# Patient Record
Sex: Female | Born: 1990 | Race: White | Hispanic: No | Marital: Married | Smoking: Former smoker
Health system: Southern US, Community
[De-identification: ages and names within clinical notes are randomized; demographics above are authoritative.]

## PROBLEM LIST (undated history)

## (undated) DIAGNOSIS — G43909 Migraine, unspecified, not intractable, without status migrainosus: Secondary | ICD-10-CM

## (undated) DIAGNOSIS — Z8619 Personal history of other infectious and parasitic diseases: Secondary | ICD-10-CM

## (undated) DIAGNOSIS — F419 Anxiety disorder, unspecified: Secondary | ICD-10-CM

## (undated) DIAGNOSIS — I8390 Asymptomatic varicose veins of unspecified lower extremity: Secondary | ICD-10-CM

## (undated) DIAGNOSIS — R51 Headache: Secondary | ICD-10-CM

## (undated) DIAGNOSIS — I10 Essential (primary) hypertension: Secondary | ICD-10-CM

## (undated) DIAGNOSIS — R519 Headache, unspecified: Secondary | ICD-10-CM

## (undated) DIAGNOSIS — IMO0002 Reserved for concepts with insufficient information to code with codable children: Secondary | ICD-10-CM

## (undated) HISTORY — DX: Personal history of other infectious and parasitic diseases: Z86.19

## (undated) HISTORY — DX: Headache: R51

## (undated) HISTORY — DX: Asymptomatic varicose veins of unspecified lower extremity: I83.90

## (undated) HISTORY — DX: Reserved for concepts with insufficient information to code with codable children: IMO0002

## (undated) HISTORY — DX: Migraine, unspecified, not intractable, without status migrainosus: G43.909

## (undated) HISTORY — DX: Essential (primary) hypertension: I10

## (undated) HISTORY — DX: Anxiety disorder, unspecified: F41.9

## (undated) HISTORY — DX: Headache, unspecified: R51.9

---
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
## 2009-06-24 LAB — LIPID PANEL: Direct LDL: 72

## 2009-06-24 LAB — HEPATIC FUNCTION PANEL
AST: 21 U/L
Alkaline Phosphatase: 87 U/L

## 2009-06-24 LAB — VITAMIN D 25 HYDROXY (VIT D DEFICIENCY, FRACTURES): Vit D, 25-Hydroxy: 40.5

## 2009-06-24 LAB — TSH: TSH: 1.61

## 2011-07-31 ENCOUNTER — Encounter: Payer: Self-pay | Admitting: Family Medicine

## 2011-07-31 ENCOUNTER — Ambulatory Visit (INDEPENDENT_AMBULATORY_CARE_PROVIDER_SITE_OTHER): Payer: Managed Care, Other (non HMO) | Admitting: Family Medicine

## 2011-07-31 VITALS — BP 152/106 | HR 80 | Temp 98.3°F | Ht 69.5 in | Wt 169.0 lb

## 2011-07-31 DIAGNOSIS — I10 Essential (primary) hypertension: Secondary | ICD-10-CM

## 2011-07-31 DIAGNOSIS — R52 Pain, unspecified: Secondary | ICD-10-CM

## 2011-07-31 DIAGNOSIS — M542 Cervicalgia: Secondary | ICD-10-CM | POA: Insufficient documentation

## 2011-07-31 DIAGNOSIS — R51 Headache: Secondary | ICD-10-CM

## 2011-07-31 DIAGNOSIS — F439 Reaction to severe stress, unspecified: Secondary | ICD-10-CM | POA: Insufficient documentation

## 2011-07-31 DIAGNOSIS — R519 Headache, unspecified: Secondary | ICD-10-CM | POA: Insufficient documentation

## 2011-07-31 DIAGNOSIS — Z0000 Encounter for general adult medical examination without abnormal findings: Secondary | ICD-10-CM

## 2011-07-31 DIAGNOSIS — Z733 Stress, not elsewhere classified: Secondary | ICD-10-CM

## 2011-07-31 HISTORY — DX: Essential (primary) hypertension: I10

## 2011-07-31 LAB — COMPREHENSIVE METABOLIC PANEL
ALT: 16 U/L (ref 0–35)
AST: 19 U/L (ref 0–37)
Albumin: 4.5 g/dL (ref 3.5–5.2)
Calcium: 9.4 mg/dL (ref 8.4–10.5)
Chloride: 106 mEq/L (ref 96–112)
Creatinine, Ser: 0.6 mg/dL (ref 0.4–1.2)
Potassium: 4.7 mEq/L (ref 3.5–5.1)
Sodium: 143 mEq/L (ref 135–145)
Total Protein: 7.3 g/dL (ref 6.0–8.3)

## 2011-07-31 LAB — LIPID PANEL
LDL Cholesterol: 50 mg/dL (ref 0–99)
Total CHOL/HDL Ratio: 2

## 2011-07-31 LAB — CBC WITH DIFFERENTIAL/PLATELET
Basophils Absolute: 0 10*3/uL (ref 0.0–0.1)
Eosinophils Absolute: 0.1 10*3/uL (ref 0.0–0.7)
Hemoglobin: 14.2 g/dL (ref 12.0–15.0)
Lymphocytes Relative: 27.2 % (ref 12.0–46.0)
MCHC: 33.9 g/dL (ref 30.0–36.0)
Neutro Abs: 3.8 10*3/uL (ref 1.4–7.7)
Platelets: 256 10*3/uL (ref 150.0–400.0)
RDW: 12.7 % (ref 11.5–14.6)

## 2011-07-31 NOTE — Assessment & Plan Note (Signed)
Exam benign today. ?stressors contributing to feeling fatigued.  Check blood work today. Check TSH.

## 2011-07-31 NOTE — Progress Notes (Signed)
Subjective:    Patient ID: Jennifer Mays, female    DOB: [birth year], 20 y.o.   MRN: [id number]  HPI CC: new pt, establish  Here for checkup, needs pap smear.  Having "weird body pains" and headaches since Jan 2012.  Describes muscle and joint pains throughout body.  Ankles, lower back, between shoulders, elbows, wrists, hips, knees.  Stands at work all day, worse at work.  Not worse in am, no stiffness.  No joint swelling, redness.  Also feeling tired and without energy.  When bending over feels dizzy.  Denies fevers/chills, cough, other flu sxs.  H/o migraines in past per pt.  But different headaches recently.  Worsening headaches, usually on right side, posterior head.  Feels like sharp shooting pain, lasts ~1 hour.  Worsened with stress.  Associated with photophobia/phonophobia.  Associated with nausea/vomiting.  excedrin helps HA, takes about 3x/wk.  Prior taking alleve.  Needs wisdom teeth removed.  Wonders if contributing to HAs.  Husband says legs and arms twitch when sleeping, during light sleep not deep.  Denies fevers/chills, abd pain, n/v/d, red eyes, new rashes, oral lesions.  Denies skin or hair changes.  Endorses heat intolerance, no cold intolerance.  Endorses recent increase in stress recently - work, home, school.  Has been married for 2 years, husband's friend living with them.  To relieve stress, does yoga/pilates 3x/wk, walks dog.  No regular exercise otherwise.  Loves to read.  Likes to ride husband's motorcycle.  Would like to start birth control.  Only thing that has used which didn't cause prolonged spotting was yaz.  Has tried seasonique (nausea/vomiting) and nuvaring (spoptting).  LMP 07/18/2011.  Not currently on birth control.  Not using protection.  Period seem to last longer, 5-7 days, regular.  Normal flow.  Preventative: No recent physical (>37yr). Previously saw [facility]i Bamberger for pap, thinks 2010 and normal.  Has had abnormal pap smears - s/p LEEP age 22yo  2007.  2 normal pap smears since then. Thinks tetanus 2011. Flu shot today declined. No recent blood work - 2010.  fasting today.  Medications and allergies reviewed and updated in chart.  Past histories reviewed and updated if relevant as below. Patient Active Problem List  Diagnoses  . HTN (hypertension)   Past Medical History  Diagnosis Date  . History of chicken pox   . Generalized headaches   . Migraines    Past Surgical History  Procedure Date  . Tonsillectomy and adenoidectomy 2002   History  Substance Use Topics  . Smoking status: Former Smoker    Quit date: 03/07/2009  . Smokeless tobacco: Never Used  . Alcohol Use: Yes     Regular, 2-4 glasses of wine/night, weekends more   Family History  Problem Relation Age of Onset  . Hypertension Father   . Asthma Father   . Coronary artery disease Paternal Grandmother 74  . Diabetes Paternal Grandfather   . Stroke Paternal Grandfather   . Hypertension Paternal Grandfather   . Cancer Neg Hx   . Alcohol abuse Maternal Grandfather   . Asthma Sister   . Parkinsonism Maternal Aunt    Allergies  Allergen Reactions  . Sulfa Drugs Cross Reactors Hives   No current outpatient prescriptions on file prior to visit.   Review of Systems  Constitutional: Negative for fever, chills, activity change, appetite change, fatigue and unexpected weight change.  HENT: Negative for hearing loss and neck pain.   Eyes: Negative for visual disturbance.  Respiratory: Negative for cough,  chest tightness, shortness of breath and wheezing.   Cardiovascular: Negative for chest pain, palpitations and leg swelling.  Gastrointestinal: Negative for nausea, vomiting, abdominal pain, diarrhea, constipation, blood in stool and abdominal distention.  Genitourinary: Negative for hematuria and difficulty urinating.  Musculoskeletal: Negative for myalgias and arthralgias.  Skin: Negative for rash.  Neurological: Positive for headaches. Negative for  dizziness, seizures and syncope.  Hematological: Does not bruise/bleed easily.  Psychiatric/Behavioral: Negative for dysphoric mood. The patient is not nervous/anxious.       Objective:   Physical Exam  Nursing note and vitals reviewed. Constitutional: She is oriented to person, place, and time. She appears well-developed and well-nourished. No distress.  HENT:  Head: Normocephalic and atraumatic.  Right Ear: External ear normal.  Left Ear: External ear normal.  Nose: Nose normal.  Mouth/Throat: Oropharynx is clear and moist.  Eyes: Conjunctivae and EOM are normal. Pupils are equal, round, and reactive to light.  Neck: Normal range of motion. Neck supple. No thyromegaly present.  Cardiovascular: Normal rate, regular rhythm, normal heart sounds and intact distal pulses.   No murmur heard. Pulses:      Radial pulses are 2+ on the right side, and 2+ on the left side.  Pulmonary/Chest: Effort normal and breath sounds normal. No respiratory distress. She has no wheezes. She has no rales.  Abdominal: Soft. Bowel sounds are normal. She exhibits no distension and no mass. There is no tenderness. There is no rebound and no guarding.  Musculoskeletal: Normal range of motion.       No synovitis.  FROM joints without pain  Lymphadenopathy:    She has no cervical adenopathy.  Neurological: She is alert and oriented to person, place, and time. She has normal strength. She is not disoriented. No cranial nerve deficit or sensory deficit. She exhibits normal muscle tone. She displays a negative Romberg sign. Coordination and gait normal.  Reflex Scores:      Bicep reflexes are 2+ on the right side and 2+ on the left side.      Patellar reflexes are 2+ on the right side and 2+ on the left side.      Achilles reflexes are 2+ on the right side and 2+ on the left side.      Normal finger to nose.  Skin: Skin is warm and dry. No rash noted.  Psychiatric: She has a normal mood and affect. Her behavior is  normal. Judgment and thought content normal.      Assessment & Plan:

## 2011-07-31 NOTE — Patient Instructions (Addendum)
Keep an eye on blood pressure, elevated today. Return in 2-3 weeks for physical (pap) and recheck blood pressure. In meantime, work on watching salt intake, increasing fruits and vegetables (potassium) and water.  Goal sodium (salt) is <1.5gm/day. We will try to get records from Tomi Bamberger. Work on stress - try to back off alcohol and try to incorporate more activity into routine. Think about what things are worth worrying about and what isn't. Good to meet you today, call us with questions.

## 2011-07-31 NOTE — Assessment & Plan Note (Signed)
Anticipate migraine picture. rec continue excedrin for now as working well. Neuro nonfocal today.

## 2011-07-31 NOTE — Assessment & Plan Note (Addendum)
New, previous bp's 120/80s per pt. First time at new office, with new doctor, did discuss stressors today, may be contributing to elevated BP. Return in 2-3 wks for f/u, recheck then.  Blood work today to check on other causes. In meantime, work on lowering salt, increasing potassium in diet.  Push fluids. If staying elevated, start med, check EKG.

## 2011-07-31 NOTE — Assessment & Plan Note (Addendum)
Portion of visit spent discussing stressors and importance of healthy coping strategies. rec increase exercise, decrease EtOH intake, pt planning on taking next semester off from school, should help as well with decreased load. To return for CPE, discussion on OCP vs other birth control.

## 2011-08-09 ENCOUNTER — Encounter: Payer: Self-pay | Admitting: Family Medicine

## 2011-08-20 ENCOUNTER — Encounter: Payer: Self-pay | Admitting: Family Medicine

## 2011-08-20 ENCOUNTER — Ambulatory Visit (INDEPENDENT_AMBULATORY_CARE_PROVIDER_SITE_OTHER): Payer: Managed Care, Other (non HMO) | Admitting: Family Medicine

## 2011-08-20 VITALS — BP 142/96 | HR 80 | Temp 98.2°F | Wt 174.0 lb

## 2011-08-20 DIAGNOSIS — Z309 Encounter for contraceptive management, unspecified: Secondary | ICD-10-CM

## 2011-08-20 DIAGNOSIS — F419 Anxiety disorder, unspecified: Secondary | ICD-10-CM

## 2011-08-20 DIAGNOSIS — F411 Generalized anxiety disorder: Secondary | ICD-10-CM

## 2011-08-20 DIAGNOSIS — Z23 Encounter for immunization: Secondary | ICD-10-CM

## 2011-08-20 DIAGNOSIS — I10 Essential (primary) hypertension: Secondary | ICD-10-CM

## 2011-08-20 MED ORDER — AMLODIPINE BESYLATE 5 MG PO TABS: 5.0000 mg | ORAL_TABLET | Freq: Every day | ORAL | Status: DC

## 2011-08-20 MED ORDER — HYDROCHLOROTHIAZIDE 12.5 MG PO CAPS: 12.5000 mg | ORAL_CAPSULE | Freq: Every day | ORAL | Status: DC

## 2011-08-20 MED ORDER — CITALOPRAM HYDROBROMIDE 10 MG PO TABS: 10.0000 mg | ORAL_TABLET | Freq: Every day | ORAL | Status: DC

## 2011-08-20 NOTE — Patient Instructions (Addendum)
Flu shot today. Keep log of blood pressures over next 2 weeks and call me with update. Read up on depo shot and let us know if you want to start - may schedule for nurse visit. Return in 1 months for follow up. Reschedule pap smear. Continue with lifestyle changes including more water, fruits/vegetables, less sodium (goal <1.5gm/day). Start celexa one daily for anxiety - continue to work on reducing stressors.

## 2011-08-20 NOTE — Progress Notes (Signed)
Subjective:    Patient ID: Jennifer Mays, female    DOB: 1991/09/09, 20 y.o.   MRN: [id number]  HPI CC: CPE today as well as bp recheck, rescheduled CPE component.  On period currently - would like to reschedule pelvic exam.  HTN - staying elevated.  Has been watching salt intake.  Drinking more water, fruits/vegetables.  At home has run 130/90s (rarely checked).    OCP - Only thing that has used which didn't cause prolonged spotting was yaz.  Has tried seasonique (nausea/vomiting) and nuvaring (spotting) for months.  LMP 08/16/2011.  Currently on period, would like to reschedule pap.  Not currently on birth control. Not using protection. Period seem to last longer, 5-7 days, regular. Normal flow.  doesn't think would want estrogen containing med 2/2 elevated BP, nor implanon/IUD.  Headaches - about same.  excedrin helps control well.  Anxiety - has had h/o anxiety in past, h/o panic attacks, only 1 in last year which is good for her.  Does feel anxiety is an issue recently, thinking about counseling vs medicine.  Will look into coverage by insurance for counselor.  Things better at home, friends moved out.  worsening this year.  Would like to discuss meds.  Feels affects daily living.  Preventative:  No recent physical (>30yr).  Previously saw Tomi Bamberger for pap, thinks 2010 and normal. Has had abnormal pap smears - s/p LEEP age 63yo 2007. 2 normal pap smears since then.  Would like to reschedule pap. Thinks tetanus 2011.  Would like flu shot today.  Wt Readings from Last 3 Encounters:  08/20/11 174 lb (78.926 kg)  07/31/11 169 lb (76.658 kg)   Review of Systems  Constitutional: Negative for fever, chills, activity change, appetite change, fatigue and unexpected weight change.  HENT: Negative for hearing loss and neck pain.   Eyes: Negative for visual disturbance.  Respiratory: Positive for cough. Negative for chest tightness, shortness of breath and wheezing.   Cardiovascular:  Negative for chest pain, palpitations and leg swelling.  Gastrointestinal: Negative for nausea, vomiting, abdominal pain, diarrhea, constipation, blood in stool and abdominal distention.  Genitourinary: Negative for hematuria and difficulty urinating.  Musculoskeletal: Negative for myalgias and arthralgias.  Skin: Negative for rash.  Neurological: Positive for headaches. Negative for dizziness, seizures and syncope.  Hematological: Bruises/bleeds easily.  Psychiatric/Behavioral: Negative for dysphoric mood. The patient is not nervous/anxious.        Objective:   Physical Exam  Nursing note and vitals reviewed. Constitutional: She is oriented to person, place, and time. She appears well-developed and well-nourished. No distress.  HENT:  Head: Normocephalic and atraumatic.  Right Ear: External ear normal.  Left Ear: External ear normal.  Nose: Nose normal.  Mouth/Throat: Oropharynx is clear and moist.  Eyes: Conjunctivae and EOM are normal. Pupils are equal, round, and reactive to light.  Neck: Normal range of motion. Neck supple. No thyromegaly present.  Cardiovascular: Normal rate, regular rhythm, normal heart sounds and intact distal pulses.   No murmur heard. Pulses:      Radial pulses are 2+ on the right side, and 2+ on the left side.  Pulmonary/Chest: Effort normal and breath sounds normal. No respiratory distress. She has no wheezes. She has no rales.  Abdominal: Soft. Bowel sounds are normal. She exhibits no distension and no mass. There is no tenderness. There is no rebound and no guarding.  Musculoskeletal: Normal range of motion.  Lymphadenopathy:    She has no cervical adenopathy.  Neurological: She  is alert and oriented to person, place, and time.       CN grossly intact, station and gait intact  Skin: Skin is warm and dry. No rash noted.  Psychiatric: She has a normal mood and affect. Her behavior is normal. Judgment and thought content normal.      Assessment &  Plan:

## 2011-08-20 NOTE — Assessment & Plan Note (Addendum)
GAD7 18/21.  Does impact ability to function. Thinking about taking semester off school next semester, will look into counseling for anxiety, will check into insurance coverage of this. Interested in pharmacotherapy to help.  Discussed other coping strategies for stress.   Discussed celexa, start at 10mg , discussed duration prior to taking effect, side effects to watch for. Denies SI/HI. RTC 1 mo for f/u. A total of 30 minutes were spent face-to-face with the patient during this encounter and over half of that time was spent on counseling and coordination of care

## 2011-08-20 NOTE — Assessment & Plan Note (Addendum)
Reviewed blood work with pt.  Great numbers. BP remaining elevated. Sulfa allergy, sinus brady on EKG. Discussed starting med.  Pt hesitant for this.  Will start anxiety med to see if can attain better control of anxiety and hopefully better control of BP with this. If not improving, will start med.  Consider amlodipine.  EKG - sinus brady at 58, nl axis, intervals, no ST/T changes.

## 2011-08-20 NOTE — Assessment & Plan Note (Signed)
Reviewed options in detail. Pt would like to avoid estrogen given elevated bp. Thinks would like to try depo shots.  Provided with reading material on this.  If desired, may call to schedule nurse visit.  check Upreg prior to starting.

## 2011-08-27 ENCOUNTER — Ambulatory Visit: Payer: Managed Care, Other (non HMO) | Admitting: Family Medicine

## 2011-08-27 DIAGNOSIS — Z0289 Encounter for other administrative examinations: Secondary | ICD-10-CM

## 2011-09-19 ENCOUNTER — Ambulatory Visit: Payer: Managed Care, Other (non HMO) | Admitting: Family Medicine

## 2011-10-09 ENCOUNTER — Ambulatory Visit: Payer: Managed Care, Other (non HMO) | Admitting: Family Medicine

## 2011-10-09 DIAGNOSIS — Z0289 Encounter for other administrative examinations: Secondary | ICD-10-CM

## 2012-02-05 DIAGNOSIS — Z8619 Personal history of other infectious and parasitic diseases: Secondary | ICD-10-CM

## 2012-02-05 HISTORY — DX: Personal history of other infectious and parasitic diseases: Z86.19

## 2012-02-12 ENCOUNTER — Telehealth: Payer: Self-pay | Admitting: Family Medicine

## 2012-02-12 ENCOUNTER — Ambulatory Visit (INDEPENDENT_AMBULATORY_CARE_PROVIDER_SITE_OTHER): Payer: Managed Care, Other (non HMO) | Admitting: Family Medicine

## 2012-02-12 ENCOUNTER — Encounter: Payer: Self-pay | Admitting: *Deleted

## 2012-02-12 ENCOUNTER — Encounter: Payer: Self-pay | Admitting: Family Medicine

## 2012-02-12 ENCOUNTER — Other Ambulatory Visit (HOSPITAL_COMMUNITY)
Admission: RE | Admit: 2012-02-12 | Discharge: 2012-02-12 | Disposition: A | Discharge status: Home / Self Care | Payer: Managed Care, Other (non HMO) | Source: Ambulatory Visit | Attending: Family Medicine | Admitting: Family Medicine

## 2012-02-12 VITALS — BP 132/80 | HR 84 | Temp 98.0°F | Wt 177.8 lb

## 2012-02-12 DIAGNOSIS — N923 Ovulation bleeding: Secondary | ICD-10-CM

## 2012-02-12 DIAGNOSIS — N921 Excessive and frequent menstruation with irregular cycle: Secondary | ICD-10-CM

## 2012-02-12 DIAGNOSIS — Z01419 Encounter for gynecological examination (general) (routine) without abnormal findings: Secondary | ICD-10-CM | POA: Insufficient documentation

## 2012-02-12 DIAGNOSIS — Z113 Encounter for screening for infections with a predominantly sexual mode of transmission: Secondary | ICD-10-CM

## 2012-02-12 DIAGNOSIS — R1031 Right lower quadrant pain: Secondary | ICD-10-CM

## 2012-02-12 DIAGNOSIS — N898 Other specified noninflammatory disorders of vagina: Secondary | ICD-10-CM | POA: Insufficient documentation

## 2012-02-12 DIAGNOSIS — Z124 Encounter for screening for malignant neoplasm of cervix: Secondary | ICD-10-CM

## 2012-02-12 DIAGNOSIS — Z309 Encounter for contraceptive management, unspecified: Secondary | ICD-10-CM

## 2012-02-12 DIAGNOSIS — I10 Essential (primary) hypertension: Secondary | ICD-10-CM

## 2012-02-12 LAB — POCT URINALYSIS DIPSTICK
Glucose, UA: NEGATIVE
Ketones, UA: NEGATIVE
Leukocytes, UA: NEGATIVE
Protein, UA: NEGATIVE
Urobilinogen, UA: 0.2

## 2012-02-12 LAB — POCT URINE PREGNANCY: Preg Test, Ur: NEGATIVE

## 2012-02-12 LAB — WET PREP, GENITAL
Trich, Wet Prep: NONE SEEN
Yeast Wet Prep HPF POC: NONE SEEN

## 2012-02-12 MED ORDER — DOXYCYCLINE HYCLATE 100 MG PO CAPS: 100.0000 mg | ORAL_CAPSULE | Freq: Two times a day (BID) | ORAL | Status: AC

## 2012-02-12 MED ORDER — CEFTRIAXONE SODIUM 1 G IJ SOLR
250.0000 mg | Freq: Once | INTRAMUSCULAR | Status: AC
  Administered 2012-02-12: 250 mg via INTRAMUSCULAR

## 2012-02-12 MED ORDER — DROSPIRENONE-ETHINYL ESTRADIOL 3-0.02 MG PO TABS: 1.0000 | ORAL_TABLET | Freq: Every day | ORAL | Status: DC

## 2012-02-12 NOTE — Assessment & Plan Note (Signed)
Started after episode of unprotected sex with new partner. Menstrual irregularities since then as well. Exam consistent with cervicitis - anticipate chlamydia or gonorrhea infection.  Treated today with shot of CTX 250mg  IM, also start doxycycline to cover clamydial infection. Discussed abstinence until pt completes treatment, discussed needs to have partner tested/treated. Discussed importance of protection 100% of time. Sent wet prep, checked HIV/RPR today. Upreg neg today.

## 2012-02-12 NOTE — Progress Notes (Signed)
  Subjective:    Patient ID: Jennifer Mays, female    DOB: 04-11-1991, 21 y.o.   MRN: [id number]  HPI CC: spotting and would like pap  Prior CPE, deferred pap 2/2 on period.  Did not return until today for pap.  Never filled celexa because never received by pharmacy and doesn't think needs anymore.  Since February, having cramping pain lower abd with cycle, periods that were prior regular and lasted 5 days, now lasting up to 8-10 days.  LMP 01/27/2012, continued spotting since then although minimal.   Some cramping pain with urination but no dysuria.  No frequency, urgency.  Currently sexually active, did have 1 new partner in January and did not use protection at that time.  Dark green discharge noted 10 d after unprotected episode.  No itching or new rash.  Denies fevers/chills, nausea/vomiting.  OCP - Only thing that has used which didn't cause prolonged spotting was yaz. Has tried seasonique (nausea/vomiting) and nuvaring (spotting) for several months. Not currently on birth control.  Not using protection.  Doesn't think would want implanon/IUD.  Previously saw Tomi Bamberger for pap, thinks last done 2010 and normal.  Has had abnormal pap smears - s/p LEEP age 89yo 2007. 2 normal pap smears since then. Would like pap today.  H/o elevated BP readings.  + fmhx HTN BP Readings from Last 3 Encounters:  02/12/12 132/80  08/20/11 142/96  07/31/11 152/106    Past Medical History  Diagnosis Date  . History of chicken pox   . Generalized headaches   . Migraines   . Abnormal Pap smear and cervical HPV (human papillomavirus) 2007    s/p LEEP    Review of Systems Per HPI    Objective:   Physical Exam  Nursing note and vitals reviewed. Constitutional: She appears well-developed and well-nourished. No distress.  HENT:  Head: Normocephalic and atraumatic.  Mouth/Throat: Oropharynx is clear and moist. No oropharyngeal exudate.  Eyes: Conjunctivae and EOM are normal. Pupils are  equal, round, and reactive to light. No scleral icterus.  Neck: Normal range of motion. Neck supple.  Cardiovascular: Normal rate, regular rhythm, normal heart sounds and intact distal pulses.   No murmur heard. Pulmonary/Chest: Effort normal and breath sounds normal. No respiratory distress. She has no wheezes. She has no rales.  Abdominal: Soft. Bowel sounds are normal. She exhibits no distension and no mass. There is no hepatosplenomegaly. There is tenderness (mild pressure pain with deep palpation) in the right lower quadrant and suprapubic area. There is no rebound, no guarding and no CVA tenderness.  Genitourinary: Pelvic exam was performed with patient supine. No labial fusion. There is no rash, tenderness, lesion or injury on the right labia. There is no rash, tenderness, lesion or injury on the left labia. Uterus is not enlarged and not tender. Cervix exhibits discharge. Cervix exhibits no motion tenderness and no friability. Right adnexum displays no mass, no tenderness and no fullness. Left adnexum displays no mass and no tenderness. No erythema, tenderness or bleeding around the vagina. No foreign body around the vagina. No signs of injury around the vagina. Vaginal discharge found.       Cervical discharge present, erythematous os No cervical motion tenderness or tenderness with bimanual exam  Musculoskeletal: She exhibits no edema.  Skin: Skin is warm and dry. No rash noted.  Psychiatric: She has a normal mood and affect.       Assessment & Plan:

## 2012-02-12 NOTE — Assessment & Plan Note (Signed)
Start Yaz.  Discussed how to start, rtc 62mo to recheck blood pressure.

## 2012-02-12 NOTE — Patient Instructions (Addendum)
Blood pressure better today but still elevated. Pass by lab for blood work. Suspicion for STD - treated with shot of rocephin, also take doxycycline twice daily for 7 days. Start yaz - sent to pharmacy.  Start on first day of next period. Return in 1 month after starting to check blood pressure and see if we need medicine for better control on birth control. Good to see you today, call us with questions. No sex until finish treatment, and until partner treated as well.

## 2012-02-12 NOTE — Assessment & Plan Note (Signed)
H/o elevated BP, but today stable off meds.  fmhx HTN.  prehypertensive blood pressure today. Starting yaz, return in 1 mo to recheck BP.

## 2012-02-14 ENCOUNTER — Other Ambulatory Visit: Payer: Self-pay | Admitting: Family Medicine

## 2012-02-14 ENCOUNTER — Encounter: Payer: Self-pay | Admitting: Family Medicine

## 2012-02-14 MED ORDER — METRONIDAZOLE 500 MG PO TABS: 500.0000 mg | ORAL_TABLET | Freq: Two times a day (BID) | ORAL | Status: AC

## 2012-03-07 DIAGNOSIS — I8390 Asymptomatic varicose veins of unspecified lower extremity: Secondary | ICD-10-CM

## 2012-03-07 HISTORY — DX: Asymptomatic varicose veins of unspecified lower extremity: I83.90

## 2012-03-26 ENCOUNTER — Ambulatory Visit (INDEPENDENT_AMBULATORY_CARE_PROVIDER_SITE_OTHER): Payer: Managed Care, Other (non HMO) | Admitting: Family Medicine

## 2012-03-26 ENCOUNTER — Encounter: Payer: Self-pay | Admitting: *Deleted

## 2012-03-26 ENCOUNTER — Encounter: Payer: Self-pay | Admitting: Family Medicine

## 2012-03-26 VITALS — BP 150/98 | HR 60 | Temp 98.4°F | Wt 177.2 lb

## 2012-03-26 DIAGNOSIS — I10 Essential (primary) hypertension: Secondary | ICD-10-CM

## 2012-03-26 DIAGNOSIS — M79604 Pain in right leg: Secondary | ICD-10-CM | POA: Insufficient documentation

## 2012-03-26 DIAGNOSIS — I8390 Asymptomatic varicose veins of unspecified lower extremity: Secondary | ICD-10-CM

## 2012-03-26 DIAGNOSIS — I83813 Varicose veins of bilateral lower extremities with pain: Secondary | ICD-10-CM | POA: Insufficient documentation

## 2012-03-26 DIAGNOSIS — M79609 Pain in unspecified limb: Secondary | ICD-10-CM

## 2012-03-26 MED ORDER — NAPROXEN 500 MG PO TABS: ORAL_TABLET | ORAL | Status: DC

## 2012-03-26 NOTE — Assessment & Plan Note (Signed)
bp elevated today, will ask her to monitor at home, if staying elevated, start HCTZ. BP Readings from Last 3 Encounters:  03/26/12 150/98  02/12/12 132/80  08/20/11 142/96

## 2012-03-26 NOTE — Patient Instructions (Addendum)
Keep an eye on blood pressure at store or pharmacy - if staying consistently >140/90, let me know for treatment. I think pain is coming from irritated varicose vein. Treat with prescription strength naprosyn for 5-7 days then as needed. Elevate legs as much as able.  Continue knee sleeve if helping. If not improving, see vein doctor (let us know if you need referral). Good to see you today, call us with questions.  Varicose Veins Varicose veins are veins that have become enlarged and twisted. CAUSES This condition is the result of valves in the veins not working properly. Valves in the veins help return blood from the leg to the heart. If these valves are damaged, blood flows backwards and backs up into the veins in the leg near the skin. This causes the veins to become larger. People who are on their feet a lot, who are pregnant, or who are overweight are more likely to develop varicose veins. SYMPTOMS   Bulging, twisted-appearing, bluish veins, most commonly found on the legs.   Leg pain or a feeling of heaviness. These symptoms may be worse at the end of the day.   Leg swelling.   Skin color changes.  DIAGNOSIS  Varicose veins can usually be diagnosed with an exam of your legs by your caregiver. He or she may recommend an ultrasound of your leg veins. TREATMENT  Most varicose veins can be treated at home.However, other treatments are available for people who have persistent symptoms or who want to treat the cosmetic appearance of the varicose veins. These include:  Laser treatment of very small varicose veins.   Medicine that is shot (injected) into the vein. This medicine hardens the walls of the vein and closes off the vein. This treatment is called sclerotherapy. Afterwards, you may need to wear clothing or bandages that apply pressure.   Surgery.  HOME CARE INSTRUCTIONS   Do not stand or sit in one position for long periods of time. Do not sit with your legs crossed. Rest with  your legs raised during the day.   Wear elastic stockings or support hose. Do not wear other tight, encircling garments around the legs, pelvis, or waist.   Walk as much as possible to increase blood flow.   Raise the foot of your bed at night with 2-inch blocks.   If you get a cut in the skin over the vein and the vein bleeds, lie down with your leg raised and press on it with a clean cloth until the bleeding stops. Then place a bandage (dressing) on the cut. See your caregiver if it continues to bleed or needs stitches.  SEEK MEDICAL CARE IF:   The skin around your ankle starts to break down.   You have pain, redness, tenderness, or hard swelling developing in your leg over a vein.   You are uncomfortable due to leg pain.  Document Released: 07/03/2005 Document Revised: 09/12/2011 Document Reviewed: 11/19/2010 [facility] Patient Information 2012 [place], [place].

## 2012-03-26 NOTE — Progress Notes (Signed)
  Subjective:    Patient ID: Jennifer Mays, female    DOB: Nov 23, 1990, 23 y.o.   MRN: [id number]  HPI CC: R leg issues  R knee problems for a few years.    Knee started hurting in February (after worked 21 d straight).  Worse over last 2 months.  Swelling noted 3 wks ago.  Denies inciting trauma/injury to knee or leg.  Works in Teaching laboratory technician, standing all day.  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  No back pain.  No fevers/chills, no shooting pain down legs.  No foot or ankle pain.  No saddle anesthesia.  Recently noticed bruising - ie right anterior foot and right medial wrist.  Occasionally feels electrical shock shooting down right leg.  Wears what sounds like neoprene sleeve on right knee when at work.  Has been taking naprosyn OTC 220mg  which does help some.  Past Medical History  Diagnosis Date  . History of chicken pox   . Generalized headaches   . Migraines   . Abnormal Pap smear and cervical HPV (human papillomavirus) 2007    s/p LEEP  . History of chlamydia 02/2012    treated    Review of Systems Per HPI    Objective:   Physical Exam  Nursing note and vitals reviewed. Constitutional: She appears well-developed and well-nourished. No distress.  Musculoskeletal: She exhibits no edema.       FROM at knees .  No crepitus with motion or tenderness to palpation Neg drawer test, neg mcmurrays, neg PF grind. No abnormal patellar mobility  Skin: Skin is warm and dry. No rash noted.       Varicose vein overlying right patella, somewhat tender to touch      Assessment & Plan:

## 2012-03-26 NOTE — Assessment & Plan Note (Signed)
Anticipate varicose vein causing many of these sxs. Pt endorses strong fmhx vein insufficiency. Treat with elevation of leg, NSAID. Consider ASA, HCTZ. If not improving with above treatment, would refer to vein specialists for further eval.

## 2012-03-26 NOTE — Assessment & Plan Note (Signed)
Could very well have knee issue, however exam normal today, anticipate majority of sxs due to inflamed varicose vein

## 2012-04-21 ENCOUNTER — Ambulatory Visit (INDEPENDENT_AMBULATORY_CARE_PROVIDER_SITE_OTHER): Payer: Managed Care, Other (non HMO) | Admitting: Family Medicine

## 2012-04-21 ENCOUNTER — Encounter: Payer: Self-pay | Admitting: Family Medicine

## 2012-04-21 VITALS — BP 136/90 | HR 88 | Temp 98.0°F | Wt 179.8 lb

## 2012-04-21 DIAGNOSIS — M542 Cervicalgia: Secondary | ICD-10-CM

## 2012-04-21 DIAGNOSIS — R071 Chest pain on breathing: Secondary | ICD-10-CM

## 2012-04-21 DIAGNOSIS — R0789 Other chest pain: Secondary | ICD-10-CM

## 2012-04-21 MED ORDER — CYCLOBENZAPRINE HCL 10 MG PO TABS: 10.0000 mg | ORAL_TABLET | Freq: Two times a day (BID) | ORAL | Status: AC | PRN

## 2012-04-21 NOTE — Patient Instructions (Addendum)
You do have right trapezius tightness/strain.  Treat with flexeril twice daily as needed for muscle relaxant.  Use caution as it can make you sleepy - if too strong, cut in half. Try massage. If pain with breathing recurs, let me know for blood work. Good to see you today, call us with questions. If not improving, return in 1 month.  If worsening, return sooner.

## 2012-04-21 NOTE — Progress Notes (Signed)
Subjective:    Patient ID: Jennifer Mays, female    DOB: 03-11-1991, 21 y.o.   MRN: [id number]  HPI CC: neck and elbow pain on right side  "bones hurt".  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  Took a leave of absence at work because not feeling well.  Neck pain and right arm pain for the last year.  No midline pain.  Worse when at computer, worse when looking down.  Also endorses paresthesias from neck to pinky, travels down lateral/posterior arm.  Only noted in 4th and 5th digits.  Tried naprosyn for arm pain, didn't help.  Tried heating pad which did help some.  Denies inciting trauma/fall.  No fevers/chills, no arm weakness.  No vision changes.  H/o migraines, currently also with HA described as frontal ache different from migraines.  Also with lower back pain, longstanding. Also with dizziness when looking down, described as some imbalance.  No falls, no presyncope or LOC or vertigo per se.  Endorsing some right sided chest pain and shoulder pain with deep breaths.  This has improved over last 3 months.  Denies SOB, leg pain, cough.  Off yaz.  No fm or personal history of blood clots.  No recent travel or immobility.  Continues to smoke.  Wt Readings from Last 3 Encounters:  04/21/12 179 lb 12 oz (81.534 kg)  03/26/12 177 lb 4 oz (80.4 kg)  02/12/12 177 lb 12 oz (80.627 kg)   Bought cuff - states bp running better at home.  Stopped yaz, because was causing HAs.  Past Medical History  Diagnosis Date  . History of chicken pox   . Generalized headaches   . Migraines   . Abnormal Pap smear and cervical HPV (human papillomavirus) 2007    s/p LEEP  . History of chlamydia 02/2012    treated   Review of Systems Per HPI    Objective:   Physical Exam  Nursing note and vitals reviewed. Constitutional: She appears well-developed and well-nourished. No distress.  HENT:  Head: Normocephalic and atraumatic.  Mouth/Throat: Oropharynx is clear  and moist. No oropharyngeal exudate.  Eyes: Conjunctivae and EOM are normal. Pupils are equal, round, and reactive to light. No scleral icterus.  Neck: Normal range of motion. Neck supple.  Cardiovascular: Normal rate, regular rhythm, normal heart sounds and intact distal pulses.   No murmur heard. Pulmonary/Chest: Effort normal and breath sounds normal. No respiratory distress. She has no wheezes. She has no rales.  Musculoskeletal:       No midline spine tenderness. FROM at neck, some tenderness with rotation to right. + L thoracic paraspinous mm tightness as well as R trapezius mm tightness/spasm and pain.  FROM at shoulders. No pain with palpation of shoulder, no deformity noted. No pain with testing SITS against resistance in int/ext rotation or empty can sign.  No pain with palpation of right elbow or wrist.  No pain at medial or lateral epicondyle.  FROM elbow, wrist. no active synovitis appreciated.  Lymphadenopathy:    She has no cervical adenopathy.  Neurological: She has normal strength. No sensory deficit. She exhibits normal muscle tone. Coordination and gait normal.  Reflex Scores:      Bicep reflexes are 2+ on the right side and 2+ on the left side.      Brachioradialis reflexes are 2+ on the right side and 2+ on the left side.      Neg tinel/phalen.  Skin: Skin  is warm and dry. No rash noted.  Psychiatric: She has a normal mood and affect.       Assessment & Plan:

## 2012-04-23 ENCOUNTER — Encounter: Payer: Self-pay | Admitting: Family Medicine

## 2012-04-23 DIAGNOSIS — M940 Chondrocostal junction syndrome [Tietze]: Secondary | ICD-10-CM | POA: Insufficient documentation

## 2012-04-23 NOTE — Assessment & Plan Note (Signed)
Anticipate MSK source.   Encouraged smoking cessation. Discussed risk factors and signs/symptoms of blood clot.   As improving, will continue to monitor for now.

## 2012-04-23 NOTE — Assessment & Plan Note (Addendum)
With some pain down right arm. Evident trapezius muscle spasms and tenderness. Recommended muscle relaxant, NSAID, and ice/heat. If not improving, to return for further eval, consider xrays as longstanding sxs. No red flags today. Also discussed chiropractor and massage therapy.

## 2012-04-26 ENCOUNTER — Encounter: Payer: Self-pay | Admitting: Family Medicine

## 2012-04-29 ENCOUNTER — Ambulatory Visit: Payer: Managed Care, Other (non HMO) | Admitting: Psychology

## 2013-01-12 ENCOUNTER — Encounter: Payer: Self-pay | Admitting: Family Medicine

## 2013-01-12 ENCOUNTER — Ambulatory Visit (INDEPENDENT_AMBULATORY_CARE_PROVIDER_SITE_OTHER): Payer: Managed Care, Other (non HMO) | Admitting: Family Medicine

## 2013-01-12 VITALS — BP 124/90 | HR 64 | Temp 98.1°F | Ht 69.25 in | Wt 197.8 lb

## 2013-01-12 DIAGNOSIS — N912 Amenorrhea, unspecified: Secondary | ICD-10-CM

## 2013-01-12 DIAGNOSIS — I10 Essential (primary) hypertension: Secondary | ICD-10-CM

## 2013-01-12 DIAGNOSIS — R51 Headache: Secondary | ICD-10-CM

## 2013-01-12 DIAGNOSIS — Z3400 Encounter for supervision of normal first pregnancy, unspecified trimester: Secondary | ICD-10-CM | POA: Insufficient documentation

## 2013-01-12 DIAGNOSIS — Z3401 Encounter for supervision of normal first pregnancy, first trimester: Secondary | ICD-10-CM

## 2013-01-12 LAB — POCT URINE PREGNANCY: Preg Test, Ur: POSITIVE

## 2013-01-12 NOTE — Assessment & Plan Note (Signed)
Borderline in past, strong fmhx.  Will expedite referral to OB given hx HTN. Has been on HCTZ in past. Did not start med today. BP Readings from Last 3 Encounters:  01/12/13 124/90  04/21/12 136/90  03/26/12 150/98

## 2013-01-12 NOTE — Assessment & Plan Note (Signed)
Discussed tylenol use and questionable ADHD association in news.

## 2013-01-12 NOTE — Patient Instructions (Addendum)
Pass by Marion's office for referral to OBGYN to establish (given history of elevated blood pressures in past). Good to see you today, congratulations!  Pregnancy If you are planning on getting pregnant, it is a good idea to make a preconception appointment with your caregiver to discuss having a healthy lifestyle before getting pregnant. This includes diet, weight, exercise, taking prenatal vitamins (especially folic acid, which helps prevent brain and spinal cord defects), avoiding alcohol, smoking and illegal drugs, medical problems (diabetes, convulsions), family history of genetic problems, working conditions, and immunizations. It is better to have knowledge of these things and do something about them before getting pregnant. During your pregnancy, it is important to follow certain guidelines in order to have a healthy baby. It is very important to get good prenatal care and follow your caregiver's instructions. Prenatal care includes all the medical care you receive before your baby's birth. This helps to prevent problems during the pregnancy and childbirth. HOME CARE INSTRUCTIONS   Start your prenatal visits by the 12th week of pregnancy or earlier, if possible. At first, appointments are usually scheduled monthly. They become more frequent in the last 2 months before delivery. It is important that you keep your caregiver's appointments and follow your caregiver's instructions regarding medication use, exercise, and diet.  During pregnancy, you are providing food for you and your baby. Eat a regular, well-balanced diet. Choose foods such as meat, fish, milk and other dairy products, vegetables, fruits, whole-grain breads and cereals. Your caregiver will inform you of the ideal weight gain depending on your current height and weight. Drink lots of liquids. Try to drink 8 glasses of water a day.  Alcohol is associated with a number of birth defects including fetal alcohol syndrome. It is best to  avoid alcohol completely. Smoking will cause low birth rate and prematurity. Use of alcohol and nicotine during your pregnancy also increases the chances that your child will be chemically dependent later in their life and may contribute to SIDS (Sudden Infant Death Syndrome).  Do not use illegal drugs.  Only take prescription or over-the-counter medications that are recommended by your caregiver. Other medications can cause genetic and physical problems in the baby.  Morning sickness can often be helped by keeping soda crackers at the bedside. Eat a few before getting up in the morning.  A sexual relationship may be continued until near the end of pregnancy if there are no other problems such as early (premature) leaking of amniotic fluid from the membranes, vaginal bleeding, painful intercourse or belly (abdominal) pain.  Exercise regularly. Check with your caregiver if you are unsure of the safety of some of your exercises.  Do not use hot tubs, steam rooms or saunas. These increase the risk of fainting and hurting yourself and the baby. Swimming is OK for exercise. Get plenty of rest, including afternoon naps when possible, especially in the third trimester.  Avoid toxic odors and chemicals.  Do not wear high heels. They may cause you to lose your balance and fall.  Do not lift over 5 pounds. If you do lift anything, lift with your legs and thighs, not your back.  Avoid long trips, especially in the third trimester.  If you have to travel out of the city or state, take a copy of your medical records with you. SEEK IMMEDIATE MEDICAL CARE IF:   You develop an unexplained oral temperature above 102 F (38.9 C), or as your caregiver suggests.  You have leaking of fluid  from the vagina. If leaking membranes are suspected, take your temperature and inform your caregiver of this when you call.  There is vaginal spotting or bleeding. Notify your caregiver of the amount and how many pads are  used.  You continue to feel sick to your stomach (nauseous) and have no relief from remedies suggested, or you throw up (vomit) blood or coffee ground like materials.  You develop upper abdominal pain.  You have round ligament discomfort in the lower abdominal area. This still must be evaluated by your caregiver.  You feel contractions of the uterus.  You do not feel the baby move, or there is less movement than before.  You have painful urination.  You have abnormal vaginal discharge.  You have persistent diarrhea.  You get a severe headache.  You have problems with your vision.  You develop muscle weakness.  You feel dizzy and faint.  You develop shortness of breath.  You develop chest pain.  You have back pain that travels down to your leg and feet.  You feel irregular or a very fast heartbeat.  You develop excessive weight gain in a short period of time (5 pounds in 3 to 5 days).  You are involved in a domestic violence situation. Document Released: 09/23/2005 Document Revised: 03/24/2012 Document Reviewed: 03/17/2009 [facility] Patient Information 2013 [place], [place]. Pregnancy - First Trimester During sexual intercourse, millions of sperm go into the vagina. Only 1 sperm will penetrate and fertilize the female egg while it is in the Fallopian tube. One week later, the fertilized egg implants into the wall of the uterus. An embryo begins to develop into a baby. At 6 to 8 weeks, the eyes and face are formed and the heartbeat can be seen on ultrasound. At the end of 12 weeks (first trimester), all the baby's organs are formed. Now that you are pregnant, you will want to do everything you can to have a healthy baby. Two of the most important things are to get good prenatal care and follow your caregiver's instructions. Prenatal care is all the medical care you receive before the baby's birth. It is given to prevent, find, and treat problems during the pregnancy and  childbirth. PRENATAL EXAMS  During prenatal visits, your weight, blood pressure and urine are checked. This is done to make sure you are healthy and progressing normally during the pregnancy.  A pregnant woman should gain 25 to 35 pounds during the pregnancy. However, if you are over weight or underweight, your caregiver will advise you regarding your weight.  Your caregiver will ask and answer questions for you.  Blood work, cervical cultures, other necessary tests and a Pap test are done during your prenatal exams. These tests are done to check on your health and the probable health of your baby. Tests are strongly recommended and done for HIV with your permission. This is the virus that causes AIDS. These tests are done because medications can be given to help prevent your baby from being born with this infection should you have been infected without knowing it. Blood work is also used to find out your blood type, previous infections and follow your blood levels (hemoglobin).  Low hemoglobin (anemia) is common during pregnancy. Iron and vitamins are given to help prevent this. Later in the pregnancy, blood tests for diabetes will be done along with any other tests if any problems develop. You may need tests to make sure you and the baby are doing well.  You may  need other tests to make sure you and the baby are doing well. CHANGES DURING THE FIRST TRIMESTER (THE FIRST 3 MONTHS OF PREGNANCY) Your body goes through many changes during pregnancy. They vary from person to person. Talk to your caregiver about changes you notice and are concerned about. Changes can include:  Your menstrual period stops.  The egg and sperm carry the genes that determine what you look like. Genes from you and your partner are forming a baby. The female genes determine whether the baby is a boy or a girl.  Your body increases in girth and you may feel bloated.  Feeling sick to your stomach (nauseous) and throwing up  (vomiting). If the vomiting is uncontrollable, call your caregiver.  Your breasts will begin to enlarge and become tender.  Your nipples may stick out more and become darker.  The need to urinate more. Painful urination may mean you have a bladder infection.  Tiring easily.  Loss of appetite.  Cravings for certain kinds of food.  At first, you may gain or lose a couple of pounds.  You may have changes in your emotions from day to day (excited to be pregnant or concerned something may go wrong with the pregnancy and baby).  You may have more vivid and strange dreams. HOME CARE INSTRUCTIONS   It is very important to avoid all smoking, alcohol and un-prescribed drugs during your pregnancy. These affect the formation and growth of the baby. Avoid chemicals while pregnant to ensure the delivery of a healthy infant.  Start your prenatal visits by the 12th week of pregnancy. They are usually scheduled monthly at first, then more often in the last 2 months before delivery. Keep your caregiver's appointments. Follow your caregiver's instructions regarding medication use, blood and lab tests, exercise, and diet.  During pregnancy, you are providing food for you and your baby. Eat regular, well-balanced meals. Choose foods such as meat, fish, milk and other low fat dairy products, vegetables, fruits, and whole-grain breads and cereals. Your caregiver will tell you of the ideal weight gain.  You can help morning sickness by keeping soda crackers at the bedside. Eat a couple before arising in the morning. You may want to use the crackers without salt on them.  Eating 4 to 5 small meals rather than 3 large meals a day also may help the nausea and vomiting.  Drinking liquids between meals instead of during meals also seems to help nausea and vomiting.  A physical sexual relationship may be continued throughout pregnancy if there are no other problems. Problems may be early (premature) leaking of  amniotic fluid from the membranes, vaginal bleeding, or belly (abdominal) pain.  Exercise regularly if there are no restrictions. Check with your caregiver or physical therapist if you are unsure of the safety of some of your exercises. Greater weight gain will occur in the last 2 trimesters of pregnancy. Exercising will help:  Control your weight.  Keep you in shape.  Prepare you for labor and delivery.  Help you lose your pregnancy weight after you deliver your baby.  Wear a good support or jogging bra for breast tenderness during pregnancy. This may help if worn during sleep too.  Ask when prenatal classes are available. Begin classes when they are offered.  Do not use hot tubs, steam rooms or saunas.  Wear your seat belt when driving. This protects you and your baby if you are in an accident.  Avoid raw meat, uncooked cheese, cat  litter boxes and soil used by cats throughout the pregnancy. These carry germs that can cause birth defects in the baby.  The first trimester is a good time to visit your dentist for your dental health. Getting your teeth cleaned is OK. Use a softer toothbrush and brush gently during pregnancy.  Ask for help if you have financial, counseling or nutritional needs during pregnancy. Your caregiver will be able to offer counseling for these needs as well as refer you for other special needs.  Do not take any medications or herbs unless told by your caregiver.  Inform your caregiver if there is any mental or physical domestic violence.  Make a list of emergency phone numbers of family, friends, hospital, and police and fire departments.  Write down your questions. Take them to your prenatal visit.  Do not douche.  Do not cross your legs.  If you have to stand for long periods of time, rotate you feet or take small steps in a circle.  You may have more vaginal secretions that may require a sanitary pad. Do not use tampons or scented sanitary  pads. MEDICATIONS AND DRUG USE IN PREGNANCY  Take prenatal vitamins as directed. The vitamin should contain 1 milligram of folic acid. Keep all vitamins out of reach of children. Only a couple vitamins or tablets containing iron may be fatal to a baby or young child when ingested.  Avoid use of all medications, including herbs, over-the-counter medications, not prescribed or suggested by your caregiver. Only take over-the-counter or prescription medicines for pain, discomfort, or fever as directed by your caregiver. Do not use aspirin, ibuprofen, or naproxen unless directed by your caregiver.  Let your caregiver also know about herbs you may be using.  Alcohol is related to a number of birth defects. This includes fetal alcohol syndrome. All alcohol, in any form, should be avoided completely. Smoking will cause low birth rate and premature babies.  Street or illegal drugs are very harmful to the baby. They are absolutely forbidden. A baby born to an addicted mother will be addicted at birth. The baby will go through the same withdrawal an adult does.  Let your caregiver know about any medications that you have to take and for what reason you take them. MISCARRIAGE IS COMMON DURING PREGNANCY A miscarriage does not mean you did something wrong. It is not a reason to worry about getting pregnant again. Your caregiver will help you with questions you may have. If you have a miscarriage, you may need minor surgery. SEEK MEDICAL CARE IF:  You have any concerns or worries during your pregnancy. It is better to call with your questions if you feel they cannot wait, rather than worry about them. SEEK IMMEDIATE MEDICAL CARE IF:   An unexplained oral temperature above 102 F (38.9 C) develops, or as your caregiver suggests.  You have leaking of fluid from the vagina (birth canal). If leaking membranes are suspected, take your temperature and inform your caregiver of this when you call.  There is  vaginal spotting or bleeding. Notify your caregiver of the amount and how many pads are used.  You develop a bad smelling vaginal discharge with a change in the color.  You continue to feel sick to your stomach (nauseated) and have no relief from remedies suggested. You vomit blood or coffee ground-like materials.  You lose more than 2 pounds of weight in 1 week.  You gain more than 2 pounds of weight in 1 week and  you notice swelling of your face, hands, feet, or legs.  You gain 5 pounds or more in 1 week (even if you do not have swelling of your hands, face, legs, or feet).  You get exposed to [place] measles and have never had them.  You are exposed to fifth disease or chickenpox.  You develop belly (abdominal) pain. Round ligament discomfort is a common non-cancerous (benign) cause of abdominal pain in pregnancy. Your caregiver still must evaluate this.  You develop headache, fever, diarrhea, pain with urination, or shortness of breath.  You fall or are in a car accident or have any kind of trauma.  There is mental or physical violence in your home. Document Released: 09/17/2001 Document Revised: 12/16/2011 Document Reviewed: 03/21/2009 [facility] Patient Information 2013 [place], [place].

## 2013-01-12 NOTE — Assessment & Plan Note (Signed)
Upreg positive. LMP 12/03/2012. Discussed first trimester pregnancy care and red flags to monitor for. Will refer to OBGYN - expedited 2/2 h/o HTN. Discussed mercury in fish, and foods to avoid. Discussed prenatal vitamins and DHA intake.

## 2013-01-12 NOTE — Progress Notes (Signed)
Subjective:    Patient ID: Jennifer Mays, female    DOB: 10-04-1991, 22 y.o.   MRN: [id number]  HPI CC: ?pregnancy  Presents with husband today who is supportive.  Has taken 3 pregnancy tests at home, all positive.  Planned pregnancy, had been trying since January 2014.  They are very excited.  LMP 12/03/2012. Last pap about 2012 H/o abnormal pap 2007 Last pap smear 02/12/2012 WNL (in our system).  + breast tenderness, and increased urinary frequency. No dysuria, urgency, back pain, abd pain. No nausea.  No fevers/chills. No vaginal bleeding.  1 dog at home (lab/huskie).  H/o elevated blood pressures. Discussed diet.  Smoking - has cut back - only 1 cig this week.  Prior was smoking 1 cig/day.  Medications and allergies reviewed and updated in chart.  Past histories reviewed and updated if relevant as below. Patient Active Problem List  Diagnosis  . HTN (hypertension)  . Headache  . Neck pain on right side  . Stress  . Anxiety  . Contraception management  . Vaginal Discharge  . Varicose vein  . Right leg pain  . Right-sided chest wall pain   Past Medical History  Diagnosis Date  . History of chicken pox   . Generalized headaches   . Migraines   . Abnormal Pap smear and cervical HPV (human papillomavirus) 2007    s/p LEEP  . History of chlamydia 02/2012    treated  . Varicose vein 03/2012    saw [facility], rec compression stocking  . HTN (hypertension) 07/31/2011   Past Surgical History  Procedure Laterality Date  . Tonsillectomy and adenoidectomy  2002  . Leep  2007   History  Substance Use Topics  . Smoking status: Current Every Day Smoker  . Smokeless tobacco: Never Used  . Alcohol Use: Yes     Comment: Regular, 2-4 glasses of wine/night, weekends more   Family History  Problem Relation Age of Onset  . Hypertension Father   . Asthma Father   . Coronary artery disease Paternal Grandmother 75  . Diabetes Paternal Grandfather   . Stroke Paternal  Grandfather   . Hypertension Paternal Grandfather   . Cancer Neg Hx   . Alcohol abuse Maternal Grandfather   . Asthma Sister   . Parkinsonism Maternal Aunt    Allergies  Allergen Reactions  . Sulfa Drugs Cross Reactors Hives  . Yaz (Drospirenone-Ethinyl Estradiol) Other (See Comments)    Headache   Current Outpatient Prescriptions on File Prior to Visit  Medication Sig Dispense Refill  . Protein POWD Take by mouth as directed.       No current facility-administered medications on file prior to visit.     Review of Systems Per HPI    Objective:   Physical Exam  Nursing note and vitals reviewed. Constitutional: She appears well-developed and well-nourished. No distress.  HENT:  Head: Normocephalic and atraumatic.  Mouth/Throat: Oropharynx is clear and moist. No oropharyngeal exudate.  Eyes: Conjunctivae and EOM are normal. Pupils are equal, round, and reactive to light. No scleral icterus.  Neck: Normal range of motion. Neck supple. No thyromegaly present.  Cardiovascular: Normal rate, regular rhythm, normal heart sounds and intact distal pulses.   No murmur heard. Pulmonary/Chest: Effort normal and breath sounds normal. No respiratory distress. She has no wheezes. She has no rales.  Abdominal: Soft. Bowel sounds are normal. She exhibits no distension and no mass. There is no tenderness. There is no rebound and no guarding.  Musculoskeletal: She  exhibits no edema.  Lymphadenopathy:    She has no cervical adenopathy.  Skin: Skin is warm and dry. No rash noted.  Psychiatric: She has a normal mood and affect.      Assessment & Plan:

## 2013-01-20 ENCOUNTER — Telehealth: Payer: Self-pay

## 2013-01-20 NOTE — Telephone Encounter (Signed)
Spoke with Raynelle Fanning and notified her of treatment.

## 2013-01-20 NOTE — Telephone Encounter (Signed)
Clarene Reamer nurse with [facility] Dept left v/m requesting treatment information for communicable disease report;positive chlamydia on 02/12/12; need date and name and dosage of med pt was treated with. Raynelle Fanning request call back.

## 2013-09-05 ENCOUNTER — Observation Stay: Payer: Self-pay | Admitting: Obstetrics and Gynecology

## 2013-09-06 ENCOUNTER — Inpatient Hospital Stay: Payer: Self-pay

## 2013-09-06 LAB — PIH PROFILE
Anion Gap: 5 — ABNORMAL LOW (ref 7–16)
Calcium, Total: 9.1 mg/dL (ref 8.5–10.1)
Chloride: 106 mmol/L (ref 98–107)
Co2: 24 mmol/L (ref 21–32)
EGFR (African American): >60
EGFR (Non-African Amer.): >60
Glucose: 92 mg/dL (ref 65–99)
HCT: 36.9 % (ref 35.0–47.0)
HGB: 12.8 g/dL (ref 12.0–16.0)
MCH: 30.9 pg (ref 26.0–34.0)
MCV: 89 fL (ref 80–100)
Osmolality: 268 (ref 275–301)
Potassium: 3.8 mmol/L (ref 3.5–5.1)
RBC: 4.13 10*6/uL (ref 3.80–5.20)
SGOT(AST): 33 U/L (ref 15–37)
Sodium: 135 mmol/L — ABNORMAL LOW (ref 136–145)
Uric Acid: 3.2 mg/dL (ref 2.6–6.0)
WBC: 16.8 10*3/uL — ABNORMAL HIGH (ref 3.6–11.0)

## 2013-09-06 LAB — PROTEIN / CREATININE RATIO, URINE: Protein, Random Urine: 22 mg/dL — ABNORMAL HIGH (ref 0–12)

## 2013-09-06 LAB — GC/CHLAMYDIA PROBE AMP

## 2013-09-07 LAB — HEMATOCRIT: HCT: 37.2 % (ref 35.0–47.0)

## 2013-10-29 ENCOUNTER — Other Ambulatory Visit: Payer: Self-pay | Admitting: Family Medicine

## 2013-10-29 NOTE — Telephone Encounter (Signed)
Message left for patient to return my call. Last visit was for for + pregnancy test. Need to know status. Also this OCP is generic for Yaz which is on her allergy list.

## 2014-04-29 ENCOUNTER — Ambulatory Visit: Payer: Self-pay | Admitting: Nurse Practitioner

## 2014-10-20 ENCOUNTER — Encounter: Payer: Self-pay | Admitting: Family Medicine

## 2014-10-20 ENCOUNTER — Ambulatory Visit (INDEPENDENT_AMBULATORY_CARE_PROVIDER_SITE_OTHER): Payer: Managed Care, Other (non HMO) | Admitting: Family Medicine

## 2014-10-20 VITALS — BP 116/68 | HR 72 | Temp 98.6°F | Wt 232.8 lb

## 2014-10-20 DIAGNOSIS — J069 Acute upper respiratory infection, unspecified: Secondary | ICD-10-CM

## 2014-10-20 DIAGNOSIS — J029 Acute pharyngitis, unspecified: Secondary | ICD-10-CM

## 2014-10-20 DIAGNOSIS — B9789 Other viral agents as the cause of diseases classified elsewhere: Principal | ICD-10-CM

## 2014-10-20 LAB — POCT INFLUENZA A/B
INFLUENZA A, POC: NEGATIVE
Influenza B, POC: NEGATIVE

## 2014-10-20 NOTE — Progress Notes (Signed)
BP 116/68 mmHg  Pulse 72  Temp(Src) 98.6 F (37 C) (Oral)  Wt 232 lb 12 oz (105.575 kg)   CC: URI sxs  Subjective:    Patient ID: Jennifer Mays, female    DOB: Jan 12, 1991, 24 y.o.   MRN: [id number]  HPI: Jennifer Mays is a 24 y.o. female presenting on 10/20/2014 for Sinusitis   2d ago woke up with neck pain and sore throat. +PNdrainage and nausea/vomiting. Watery eyes, headache. Congested. + bilateral ear pain. Some body aches.   No fevers. No cough.  No sick contacts at home.  No smokers at home.  No h/o asthma.  Has tried pseudophed and tylenol. Drinking warm tea with honey. Cough drops.   Currently pregnant, 35 wks. Has been using unisom and B6.   Relevant past medical, surgical, family and social history reviewed and updated as indicated. Interim medical history since our last visit reviewed. Allergies and medications reviewed and updated. Current Outpatient Prescriptions on File Prior to Visit  Medication Sig  . fish oil-omega-3 fatty acids 1000 MG capsule Take 2 g by mouth daily.  . Prenatal Vit-Fe Fumarate-FA (PRENATAL MULTIVITAMIN) TABS Take 1 tablet by mouth daily at 12 noon.   No current facility-administered medications on file prior to visit.    Review of Systems Per HPI unless specifically indicated above     Objective:    BP 116/68 mmHg  Pulse 72  Temp(Src) 98.6 F (37 C) (Oral)  Wt 232 lb 12 oz (105.575 kg)  Wt Readings from Last 3 Encounters:  10/20/14 232 lb 12 oz (105.575 kg)  01/12/13 197 lb 12 oz (89.699 kg)  04/21/12 179 lb 12 oz (81.534 kg)    Physical Exam  Constitutional: She appears well-developed and well-nourished. No distress.  HENT:  Head: Normocephalic and atraumatic.  Right Ear: Hearing, tympanic membrane, external ear and ear canal normal.  Left Ear: Hearing, tympanic membrane, external ear and ear canal normal.  Nose: Mucosal edema and rhinorrhea present. Right sinus exhibits no maxillary sinus tenderness and no frontal  sinus tenderness. Left sinus exhibits no maxillary sinus tenderness and no frontal sinus tenderness.  Mouth/Throat: Uvula is midline and mucous membranes are normal. Posterior oropharyngeal erythema present. No oropharyngeal exudate, posterior oropharyngeal edema or tonsillar abscesses.  Evidently congested Injected nares  Eyes: Conjunctivae and EOM are normal. Pupils are equal, round, and reactive to light. No scleral icterus.  Neck: Normal range of motion. Neck supple.  Cardiovascular: Normal rate, regular rhythm, normal heart sounds and intact distal pulses.   No murmur heard. Pulmonary/Chest: Effort normal and breath sounds normal. No respiratory distress. She has no wheezes. She has no rales.  Lymphadenopathy:    She has cervical adenopathy (RAC LAD).  Skin: Skin is warm and dry. No rash noted.  Nursing note and vitals reviewed.  Results for orders placed or performed in visit on 10/20/14  POCT Influenza A/B  Result Value Ref Range   Influenza A, POC Negative    Influenza B, POC Negative       Assessment & Plan:   Problem List Items Addressed This Visit    Viral URI with cough - Primary    Anticipate viral URI - discussed this Flu swab - negative Treat supportively with fluids, rest.  Ok to take tylenol. Discussed other home remedies like tea, honey with lemon, etc Update if red flags. Antihistamine to dry rhinorrhea.  Pt agrees with plan.       Other Visit Diagnoses  Sore throat        Relevant Orders    POCT Influenza A/B (Completed)        Follow up plan: Return if symptoms worsen or fail to improve.

## 2014-10-20 NOTE — Patient Instructions (Signed)
You have a viral upper respiratory infection. Antibiotics are not needed for this.  Viral infections usually take 7-10 days to resolve.  The cough can last a few weeks to go away. Push fluids and plenty of rest. May use tylenol as needed for discomfort. Please return if you are not improving as expected, or if you have high fevers (>101.5) or more difficulty swallowing or worsening productive cough. Call clinic with questions.  Good to see you today.

## 2014-10-20 NOTE — Assessment & Plan Note (Addendum)
Anticipate viral URI - discussed this Flu swab - negative Treat supportively with fluids, rest.  Ok to take tylenol. Discussed other home remedies like tea, honey with lemon, etc Update if red flags. Antihistamine to dry rhinorrhea.  Pt agrees with plan.

## 2014-10-20 NOTE — Progress Notes (Signed)
Pre visit review using our clinic review tool, if applicable. No additional management support is needed unless otherwise documented below in the visit note. 

## 2014-11-18 ENCOUNTER — Inpatient Hospital Stay: Payer: Self-pay | Admitting: Obstetrics and Gynecology

## 2015-02-14 NOTE — H&P (Signed)
L&D Evaluation:  History Expanded:  HPI 24 yo G3P1011 at 7449w2d gestational age by LMP consistent with 12 week ultrasound.  Her pregnancy was complicated by obesity with BMI of 34, thrombophlebitis in July, history of postpartum  depression.  She was started on Zoloft on on 08/08/14.  She is currently taking 50mg .  She started having contractions throughout the day.  At 1130pm last night she awoke and thought she broke her water.  She presented to L&D shortly thereafter where ROM was confirmed.   Blood Type (Maternal) B positive   Group B Strep Results Maternal (Result >5wks must be treated as unknown) negative   Maternal HIV Negative   Maternal Syphilis Ab Nonreactive   Maternal Varicella Immune   Rubella Results (Maternal) immune   EDC 18-Nov-2014   Patient's Medical History 1) history of cervical dysplasia, 2) migraines   Patient's Surgical History 1) LEEP, 2) T&A   Allergies Sulfa   Social History none   Family History Non-Contributory   ROS:  ROS All systems were reviewed.  HEENT, CNS, GI, GU, Respiratory, CV, Renal and Musculoskeletal systems were found to be normal., unless noted in HPI   Exam:  Vital Signs T98.1,  BP 129/94, P92   General Appears to be in pain with contractions   Mental Status clear   Chest moves air well   Abdomen gravid, tender with contractions   Estimated Fetal Weight 8 pounds   Fetal Position cephalic   Back no CVAT   Edema no edema   Pelvic no external lesions, complete and pushing (7cm at presentation)   Mebranes Ruptured   Description clear   FHT normal rate with no decels   FHT Description 130/mod var/+accels/no decels   Ucx regular, 3 q 10 min   Skin no lesions   Impression:  Impression active labor, 1) Intrauterine pregnancy at 1549w2d gestational age, 2) labor, 3) SROM   Plan:  Comments 1) Labor: expectant management  2) Fetus - category I tracing  3) PNL B positive / ABSC negative / RI / VZI / HIV neg / RPR  NR / HBsAg neg / 1st trimester screen negative / early 1 hr GTT nml, 28 week 1-hr OGTT nml / GBS negative / total weight gain this pregnancy = 5lb     4) TDAP - 09/26/14 [place] Sizer/flu - 08/08/14    5) history of postpartum depression: needs early follow up  6) Disposition - home postpartum   Electronic Signatures: Conard NovakJackson, Esteban Kobashigawa D (MD)  (Signed 12-Feb-16 01:58)  Authored: L&D Evaluation   Last Updated: 12-Feb-16 01:58 by Conard NovakJackson, Jadda Hunsucker D (MD)

## 2015-02-14 NOTE — H&P (Signed)
L&D Evaluation:  History:  HPI 24 yo G2P0010 at 554w3d by D=10wk US derived EDC of 09/09/2013 presenting with contractions.  Contractions had been regular since this morning around 0600 but have spaced out since coming to L&D.  Cerv on last check in clinic noted to be 2/70/-1 (08/27/13).  +FM, no LOF, some light spotting today  [facility], note made of prehypertension/white coat hypertension in problem list.  19lbs weight gain thus far in clinic with normal blood pressures other than a 100 diastolic noted at last visit normalized on repeat with negative protein dip.   Presents with contractions   Patient's Medical History prehypertension. migraines   Patient's Surgical History LEEP  Tonsillectomy   Medications Pre Natal Vitamins  flonase   Allergies Sulfa   Social History none   Family History Non-Contributory   Exam:  Vital Signs Iniital two BP 142-144/88-95 (remaining 123-134/70-91) patient with previous diagnosis of white coat hypertension   Urine Protein negative dipstick   General no apparent distress   Mental Status clear   Chest no increased work of breathing   Abdomen gravid, non-tender   Estimated Fetal Weight Average for gestational age   Fetal Position vtx   Edema no edema   Pelvic 4/90/-1 unchanged over the last hour   FHT 140, moderate, positive accels, no decels reactive/Cateogry I tracing   Ucx irregular, q5910min   Impression:  Impression Braxton Hicks contractions   Plan:  Plan EFM/NST, monitor contractions and for cervical change   Comments 1) R/O labor - irregular contraction, no cervical change on recheck, routine labor precautions.  [place] [facility] [place] [facility] [address]  2) Fetus - category I tracing  3) PNL - B positive / ABSC neg / RI / VZI / RPR NR / HIV neg / HBsAg neg / 1-hr OGTT 91 / GBS positive (08/11/2013)  4) Immunizations: TDAP and influenza administered 07/07/13  5) Elevated BP on admission -  normalized soon after admission, negative protein dip  6) Disposition - discharge home, follow up on 09/06/2013   Electronic Signatures: Lorrene ReidStaebler, Marybeth Dandy M (MD)  (Signed [phone]-Nov-14 17:58)  Authored: L&D Evaluation   Last Updated: 30-Nov-14 17:58 by Lorrene ReidStaebler, Alyxandra Tenbrink M (MD)

## 2015-02-14 NOTE — H&P (Signed)
L&D Evaluation:  History:  HPI 24 yo G2P0010 at 138w4d by D=10wk US derived EDC of 09/09/2013 presenting with contractions.  Contractions had been regular since yesterday morning around 0600 but have spaced out on arrival to L&D yesterday afternoon.  She was 4cm at that time and had no cervical change on recheck (stable at 4cm).  Represents with increasing frequency and strength of contractions.   +FM, no LOF, some light spotting today  [facility], note made of prehypertension/white coat hypertension in problem list.  19lbs weight gain thus far in clinic with normal blood pressures other than a 100 diastolic noted at last visit normalized on repeat with negative protein dip.   Presents with contractions   Patient's Medical History prehypertension. migraines   Patient's Surgical History LEEP  Tonsillectomy   Medications Pre Natal Vitamins  flonase   Allergies Sulfa   Social History none   Family History Non-Contributory   Exam:  Vital Signs BP >140/90   Urine Protein negative dipstick   General no apparent distress   Mental Status clear   Chest no increased work of breathing   Abdomen gravid, non-tender   Estimated Fetal Weight Average for gestational age   Fetal Position vtx   Edema no edema   Pelvic 4/90/-1 unchanged over the last hour   FHT 140, moderate, positive accels, no decels reactive/Cateogry I tracing   Ucx irregular, q4110min   Impression:  Impression active labor   Plan:  Plan EFM/NST, monitor contractions and for cervical change   Comments 1) Admit for term labor  2) Fetus - category I tracing  3) PNL - B positive / ABSC neg / RI / VZI / RPR NR / HIV neg / HBsAg neg / 1-hr OGTT 91 / GBS positive (08/11/2013)     - ampicillin for GBS ppx  4) Immunizations: TDAP and influenza administered 07/07/13  5) Elevated BP on admission - will send PIH labs with admission labs  6) Disposition - anticipate vaginal delivery   Electronic  Signatures: Lorrene ReidStaebler, Khyre Germond M (MD)  (Signed 01-Dec-14 03:48)  Authored: L&D Evaluation   Last Updated: 01-Dec-14 03:48 by Lorrene ReidStaebler, Karee Christopherson M (MD)

## 2015-10-22 HISTORY — PX: INTRAUTERINE DEVICE INSERTION: SHX323

## 2016-03-27 ENCOUNTER — Ambulatory Visit (INDEPENDENT_AMBULATORY_CARE_PROVIDER_SITE_OTHER): Payer: Managed Care, Other (non HMO) | Admitting: Family Medicine

## 2016-03-27 ENCOUNTER — Encounter: Payer: Self-pay | Admitting: Family Medicine

## 2016-03-27 VITALS — BP 132/100 | HR 72 | Temp 98.6°F | Ht 69.5 in | Wt 258.2 lb

## 2016-03-27 DIAGNOSIS — M5442 Lumbago with sciatica, left side: Secondary | ICD-10-CM | POA: Diagnosis not present

## 2016-03-27 MED ORDER — DICLOFENAC SODIUM 75 MG PO TBEC: 75.0000 mg | DELAYED_RELEASE_TABLET | Freq: Two times a day (BID) | ORAL | Status: DC

## 2016-03-27 MED ORDER — TIZANIDINE HCL 4 MG PO TABS: 4.0000 mg | ORAL_TABLET | Freq: Every evening | ORAL | Status: DC

## 2016-03-27 NOTE — Progress Notes (Signed)
Dr. Karleen Hampshire T. Audley Hinojos, MD, CAQ [facility] [address] [place], [address] Phone: [phone] Fax: [phone]  03/27/2016  Patient: Jennifer Mays, MRN: [id number], DOB: 1990-12-05, 25 y.o.  Primary Physician:  Eustaquio Boyden, MD   Chief Complaint  Patient presents with  . Sciatica    Left   Subjective:   Jennifer Mays is a 25 y.o. very pleasant female patient who presents with the following: Back Pain  ongoing for approximately: 2 years The patient has had back pain before. The back pain is localized into the lumbar spine area. They also describe radiculopathy.  L sided sciatica at the time when pregnant.  Had 2 years ago.  Sometimes will get a flare-up and that whole month, it will be really bad.  Multiple OTC products have not helped.   Lifting weight, jogging, burning are all uncomfortable.   Body mass index is 37.6 kg/(m^2).   No numbness or tingling. No bowel or bladder incontinence. No focal weakness. Prior interventions: none Physical therapy: No Chiropractic manipulations: No Acupuncture: No Osteopathic manipulation: No Heat or cold: Minimal effect  Past Medical History, Surgical History, Family History, Medications, Allergies have been reviewed and updated if relevant.  Patient Active Problem List   Diagnosis Date Noted  . Viral URI with cough 10/20/2014  . Supervision of normal first pregnancy 01/12/2013  . Right-sided chest wall pain 04/23/2012  . Varicose vein 03/26/2012  . Right leg pain 03/26/2012  . Vaginal discharge 02/12/2012  . Anxiety 08/20/2011  . Contraception management 08/20/2011  . HTN (hypertension) 07/31/2011  . Headache(784.0) 07/31/2011  . Neck pain on right side 07/31/2011  . Stress 07/31/2011    Past Medical History  Diagnosis Date  . History of chicken pox   . Generalized headaches   . Migraines   . Abnormal Pap smear and cervical HPV (human papillomavirus) 2007   s/p LEEP  . History of chlamydia 02/2012    treated  . Varicose vein 03/2012    saw [facility], rec compression stocking  . HTN (hypertension) 07/31/2011    Past Surgical History  Procedure Laterality Date  . Tonsillectomy and adenoidectomy  2002  . Leep  2007    Social History   Social History  . Marital Status: Married    Spouse Name: N/A  . Number of Children: N/A  . Years of Education: N/A   Occupational History  . Not on file.   Social History Main Topics  . Smoking status: Former Smoker    Quit date: 10/07/2012  . Smokeless tobacco: Never Used  . Alcohol Use: No     Comment: Not currently due to pregnancy  . Drug Use: No  . Sexual Activity:    Partners: Male   Other Topics Concern  . Not on file   Social History Narrative   Caffeine: 1-2 cups coffee/am   Lives with husband, husband's friends, 1 dog   Studying psychology in [place]   Activity: yoga/pilates, no other reg exercise   Diet: good fruits/vegetables, avoids fast foods/processed foods, good amt water    Family History  Problem Relation Age of Onset  . Hypertension Father   . Asthma Father   . Coronary artery disease Paternal Grandmother 61  . Diabetes Paternal Grandfather   . Stroke Paternal Grandfather   . Hypertension Paternal Grandfather   . Cancer Neg Hx   . Alcohol abuse Maternal Grandfather   . Asthma Sister   .  Parkinsonism Maternal Aunt     Allergies  Allergen Reactions  . Sulfa Drugs Cross Reactors Hives  . Yaz [Drospirenone-Ethinyl Estradiol] Other (See Comments)    Headache    Medication list reviewed and updated in full in  Link.  GEN: No fevers, chills. Nontoxic. Primarily MSK c/o today. MSK: Detailed in the HPI GI: tolerating PO intake without difficulty Neuro: As above  Otherwise the pertinent positives of the ROS are noted above.    Objective:   Blood pressure 132/100, pulse 72, temperature 98.6 F (37 C), temperature source Oral, height 5' 9.5" (1.765  m), weight 258 lb 4 oz (117.141 kg), last menstrual period 03/02/2016, unknown if currently breastfeeding.  Gen: Well-developed,well-nourished,in no acute distress; alert,appropriate and cooperative throughout examination HEENT: Normocephalic and atraumatic without obvious abnormalities.  Ears, externally no deformities Pulm: Breathing comfortably in no respiratory distress Range of motion at  the waist: Flexion, rotation and lateral bending: full  No echymosis or edema Rises to examination table with no difficulty Gait: minimally antalgic  Inspection/Deformity: No abnormality Paraspinus T:  Mildly tender l2-s1  B Ankle Dorsiflexion (L5,4): 5/5 B Great Toe Dorsiflexion (L5,4): 5/5 Heel Walk (L5): WNL Toe Walk (S1): WNL Rise/Squat (L4): WNL, mild pain  SENSORY B Medial Foot (L4): WNL B Dorsum (L5): WNL B Lateral (S1): WNL Light Touch: WNL Pinprick: WNL  REFLEXES Knee (L4): 2+ Ankle (S1): 2+  B SLR, seated: neg B SLR, supine: neg B FABER: neg B Reverse FABER: neg B Greater Troch: NT B Log Roll: neg B Stork: NT B Sciatic Notch: ttp on the L  Radiology: No results found.  Assessment and Plan:   Left-sided low back pain with left-sided sciatica - Plan: Ambulatory referral to Physical Therapy  90 pound weight gain is likely contributing.  4 years.  Recommended weight loss, regular aerobic activity, formal physical therapy.  Also suggested that she try yoga and swimming.  If she is having a flareup, then some anti-inflammatories and muscle relaxants at nighttime might be reasonable.  Start with medications, core rehab, and progress from there following low back pain algorithm. No red flags are present.  Follow-up: prn  New Prescriptions   DICLOFENAC (VOLTAREN) 75 MG EC TABLET    Take 1 tablet (75 mg total) by mouth 2 (two) times daily.   TIZANIDINE (ZANAFLEX) 4 MG TABLET    Take 1 tablet (4 mg total) by mouth Nightly.   Orders Placed This Encounter  Procedures  .  Ambulatory referral to Physical Therapy    Signed,  Karleen HampshireSpencer T. Julieanne Hadsall, MD   Patient's Medications  New Prescriptions   DICLOFENAC (VOLTAREN) 75 MG EC TABLET    Take 1 tablet (75 mg total) by mouth 2 (two) times daily.   TIZANIDINE (ZANAFLEX) 4 MG TABLET    Take 1 tablet (4 mg total) by mouth Nightly.  Previous Medications   COD LIVER OIL 1000 MG CAPS    Take 5,000 mg by mouth daily.   MULTIPLE VITAMINS-MINERALS (MULTIVITAMIN WITH MINERALS) TABLET    Take 1 tablet by mouth daily.   SERTRALINE (ZOLOFT) 50 MG TABLET    Take 50 mg by mouth daily.  Modified Medications   No medications on file  Discontinued Medications   FISH OIL-OMEGA-3 FATTY ACIDS 1000 MG CAPSULE    Take 2 g by mouth daily.   PRENATAL VIT-FE FUMARATE-FA (PRENATAL MULTIVITAMIN) TABS    Take 1 tablet by mouth daily at 12 noon.

## 2016-03-27 NOTE — Progress Notes (Signed)
Pre visit review using our clinic review tool, if applicable. No additional management support is needed unless otherwise documented below in the visit note. 

## 2016-05-24 ENCOUNTER — Telehealth: Payer: Self-pay | Admitting: Family Medicine

## 2016-05-24 NOTE — Telephone Encounter (Signed)
Patient goes to [facility] and she needs a tb test done for a co op. Please call patient to let her know when she can come in. Patient needs it done by next week.

## 2016-05-24 NOTE — Telephone Encounter (Signed)
Spoke with patient and nurse visit scheduled.

## 2016-05-28 ENCOUNTER — Ambulatory Visit (INDEPENDENT_AMBULATORY_CARE_PROVIDER_SITE_OTHER): Payer: Managed Care, Other (non HMO) | Admitting: Family Medicine

## 2016-05-28 DIAGNOSIS — Z0279 Encounter for issue of other medical certificate: Secondary | ICD-10-CM | POA: Diagnosis not present

## 2016-05-28 NOTE — Progress Notes (Signed)
PPD

## 2016-05-30 LAB — TB SKIN TEST
INDURATION: 0 mm
TB SKIN TEST: NEGATIVE

## 2017-05-08 ENCOUNTER — Other Ambulatory Visit: Payer: Self-pay | Admitting: Certified Nurse Midwife

## 2017-05-08 ENCOUNTER — Encounter: Payer: Self-pay | Admitting: Certified Nurse Midwife

## 2017-05-08 ENCOUNTER — Ambulatory Visit (INDEPENDENT_AMBULATORY_CARE_PROVIDER_SITE_OTHER): Payer: Managed Care, Other (non HMO) | Admitting: Certified Nurse Midwife

## 2017-05-08 VITALS — BP 134/96 | HR 82 | Ht 70.0 in | Wt 231.0 lb

## 2017-05-08 DIAGNOSIS — Z13220 Encounter for screening for lipoid disorders: Secondary | ICD-10-CM

## 2017-05-08 DIAGNOSIS — R030 Elevated blood-pressure reading, without diagnosis of hypertension: Secondary | ICD-10-CM

## 2017-05-08 DIAGNOSIS — Z975 Presence of (intrauterine) contraceptive device: Secondary | ICD-10-CM | POA: Diagnosis not present

## 2017-05-08 DIAGNOSIS — Z124 Encounter for screening for malignant neoplasm of cervix: Secondary | ICD-10-CM

## 2017-05-08 DIAGNOSIS — Z1329 Encounter for screening for other suspected endocrine disorder: Secondary | ICD-10-CM | POA: Diagnosis not present

## 2017-05-08 DIAGNOSIS — N920 Excessive and frequent menstruation with regular cycle: Secondary | ICD-10-CM

## 2017-05-08 DIAGNOSIS — Z131 Encounter for screening for diabetes mellitus: Secondary | ICD-10-CM

## 2017-05-08 DIAGNOSIS — Z01419 Encounter for gynecological examination (general) (routine) without abnormal findings: Secondary | ICD-10-CM | POA: Diagnosis not present

## 2017-05-08 NOTE — Progress Notes (Signed)
Gynecology Annual Exam  PCP: Eustaquio BoydenGutierrez, Javier, MD  Chief Complaint:  Chief Complaint  Patient presents with  . Gynecologic Exam    History of Present Illness:Jennifer Mays for her annual exam. She is a 26 year old Caucasian/White female, G3 P2012, whose LMP was 04/15/2017. She is having problems with long and sometimes heavy bleeding with her Paraguard IUD  Her menses are q month, last 8-10 days with 2-3 heavier days requiring tampon or pad change every 1-2 hours. She has cramping that starts just before her bleeding starts and tapers off after her heavy days of bleeding. Occasionally needs to take analgesics with relief. Last Pap: 04/22/2014 Result: NIL Ever had a abnormal pap yes 2007, pt had LEEP in 2007  Contraception Method: Paragaurd inserted 10/22/2015 .  The patient's past medical history is remarkable for anxiety, varicose veins, cervical dysplasia, prehypertension, and migraine headaches. Since her last annual GYN exam 09/13/2015, she has lost over 30# with diet and exercise.. She is no longer taking Zoloft for anxiety .She is taking Social research officer, governmentt John's Wort. She is sexually active and reports no problems with intercourse..  There is no family history of breast cancer The patient has recently started doing monthly self breast exams.  The patient is  Drinks wine on the weekend and does not smoke. The patient does not exercise regularly.  She has not had a recent cholesterol screen and is interested in screening. She does get adequate calcium in her diet.    Review of Systems: Review of Systems  Constitutional: Positive for weight loss (intentional). Negative for chills and fever.  HENT: Negative for congestion, sinus pain and sore throat.   Eyes: Negative for blurred vision and pain.  Respiratory: Negative for hemoptysis, shortness of breath and wheezing.   Cardiovascular: Negative for chest pain, palpitations and leg swelling.       Positive for varicose veins    Gastrointestinal: Negative for abdominal pain, blood in stool, diarrhea, heartburn, nausea and vomiting.  Genitourinary: Negative for dysuria, frequency, hematuria and urgency.       Positive for menorrhagia and dysmenorrhea  Musculoskeletal: Negative for back pain, joint pain and myalgias.  Skin: Negative for itching and rash.  Neurological: Negative for dizziness, tingling and headaches.  Endo/Heme/Allergies: Negative for environmental allergies and polydipsia. Does not bruise/bleed easily.       Negative for hirsutism   Psychiatric/Behavioral: Negative for depression. The patient does not have insomnia.     Past Medical History:  Past Medical History:  Diagnosis Date  . Abnormal Pap smear and cervical HPV (human papillomavirus) 2007   s/p LEEP for cervical dysplasia  . Anxiety   . Generalized headaches   . History of chicken pox   . History of chlamydia 02/2012   treated  . HTN (hypertension) 07/31/2011  . Migraines   . Varicose vein 03/2012   saw [facility], rec compression stocking    Past Surgical History:  Past Surgical History:  Procedure Laterality Date  . INTRAUTERINE DEVICE INSERTION  10/22/2015  . LEEP  2007  . TONSILLECTOMY AND ADENOIDECTOMY  2002    Family History:  Family History  Problem Relation Age of Onset  . Hypertension Father   . Asthma Father   . Coronary artery disease Paternal Grandmother [id number]       MI  . Hypertension Paternal Grandmother   . Diabetes Paternal Grandfather   . Stroke Paternal Grandfather   . Hypertension Paternal Grandfather   . Alcohol  abuse Maternal Grandfather   . Asthma Sister   . Parkinsonism Maternal Aunt   . Hypertension Paternal Aunt   . Hypertension Paternal Uncle   . Cancer Neg Hx     Social History:  Social History   Social History  . Marital status: Married    Spouse name: N/A  . Number of children: 2  . Years of education: N/A   Occupational History  . Homemaker    Social History Main Topics  . Smoking  status: Former Smoker    Quit date: 10/07/2012  . Smokeless tobacco: Never Used  . Alcohol use 1.2 oz/week    2 Glasses of wine per week     Comment: drinks on weekends  . Drug use: No  . Sexual activity: Yes    Partners: Male    Birth control/ protection: IUD     Comment: Paraguard   Other Topics Concern  . Not on file   Social History Narrative   Caffeine: 1-2 cups coffee/am   Lives with husband, husband's friends, 1 dog   Studying psychology in [employer]   Activity: yoga/pilates, no other reg exercise   Diet: good fruits/vegetables, avoids fast foods/processed foods, good amt water    Allergies:  Allergies  Allergen Reactions  . Sulfa Drugs Cross Reactors Hives  . Yaz [Drospirenone-Ethinyl Estradiol] Other (See Comments)    Headache    Medications: multivitamin and St john's Wort  Physical Exam BP (!) 134/96 (BP Location: Left Arm, Patient Position: Sitting, Cuff Size: Large)   Pulse 82   Ht 5\' 10"  (1.778 m)   Wt 231 lb (104.8 kg)   LMP 04/15/2017 (Exact Date)   Breastfeeding? No Comment: Paragard  BMI 33.15 kg/m   General:WF in  NAD HEENT: normocephalic, anicteric Neck: no thyroid enlargement, no palpable nodules, no cervical lymphadenopathy  Pulmonary: No increased work of breathing, CTAB Cardiovascular: RRR, without murmur  Breast: Breast symmetrical, no tenderness, no palpable nodules or masses, no skin or nipple retraction present, no nipple discharge.  No axillary, infraclavicular or supraclavicular lymphadenopathy. Abdomen: Soft, non-tender, non-distended.  Umbilicus without lesions.  No hepatomegaly or masses palpable. No evidence of hernia. Genitourinary:  External: Normal external female genitalia.  Normal urethral meatus, normal Bartholin's and Skene's glands.    Vagina: Normal vaginal mucosa, no evidence of prolapse.    Cervix: Grossly normal in appearance, friable with Pap, non-tender, IUD strings visible  Uterus: Retroflexed, normal size, shape,  and consistency, mobile, and non-tender  Adnexa: No adnexal masses, non-tender  Rectal: deferred  Lymphatic: no evidence of inguinal lymphadenopathy Extremities: no edema, erythema Neurologic: Grossly intact Psychiatric: mood appropriate, affect full     Assessment: 26 y.o. [id number] annual gyn exam Menorrhagia Plan:   1) Breast cancer screening - recommend monthly self breast exam. Start mammograms at age [age]  2) Check CBC/TSH due to menorrhagia. Discussed trying Aleve BID at start of menses thru heaviest days of bleeding to see if it reduces the menstrual flow  3) Cervical cancer screening - Pap was done. ASCCP guidelines and rational discussed.  Patient opts for yearly screening interva  4) Routine healthcare maintenance including cholesterol and diabetes screening ordered Mays    Farrel Connersolleen Countess Biebel, CNM

## 2017-05-09 LAB — HEMOGLOBIN A1C
Est. average glucose Bld gHb Est-mCnc: 94 mg/dL
Hgb A1c MFr Bld: 4.9 % (ref 4.8–5.6)

## 2017-05-09 LAB — TSH: TSH: 1.72 u[IU]/mL (ref 0.450–4.500)

## 2017-05-09 LAB — LIPID PANEL WITH LDL/HDL RATIO
Cholesterol, Total: 205 mg/dL — ABNORMAL HIGH (ref 100–199)
HDL: 69 mg/dL (ref >39)
LDL CALC: 101 mg/dL — AB (ref 0–99)
LDl/HDL Ratio: 1.5 ratio (ref 0.0–3.2)
Triglycerides: 176 mg/dL — ABNORMAL HIGH (ref 0–149)
VLDL CHOLESTEROL CAL: 35 mg/dL (ref 5–40)

## 2017-05-09 LAB — CBC WITH DIFFERENTIAL/PLATELET
BASOS ABS: 0 10*3/uL (ref 0.0–0.2)
BASOS: 0 %
EOS (ABSOLUTE): 0 10*3/uL (ref 0.0–0.4)
Eos: 0 %
HEMOGLOBIN: 14.4 g/dL (ref 11.1–15.9)
Hematocrit: 43.3 % (ref 34.0–46.6)
IMMATURE GRANS (ABS): 0 10*3/uL (ref 0.0–0.1)
IMMATURE GRANULOCYTES: 0 %
LYMPHS: 30 %
Lymphocytes Absolute: 2.6 10*3/uL (ref 0.7–3.1)
MCH: 30.6 pg (ref 26.6–33.0)
MCHC: 33.3 g/dL (ref 31.5–35.7)
MCV: 92 fL (ref 79–97)
MONOCYTES: 10 %
Monocytes Absolute: 0.9 10*3/uL (ref 0.1–0.9)
NEUTROS ABS: 5.2 10*3/uL (ref 1.4–7.0)
NEUTROS PCT: 60 %
Platelets: 270 10*3/uL (ref 150–379)
RBC: 4.71 x10E6/uL (ref 3.77–5.28)
RDW: 13.6 % (ref 12.3–15.4)
WBC: 8.7 10*3/uL (ref 3.4–10.8)

## 2017-05-12 LAB — IGP,RFX APTIMA HPV ALL PTH: PAP Smear Comment: 0

## 2017-09-08 ENCOUNTER — Ambulatory Visit (INDEPENDENT_AMBULATORY_CARE_PROVIDER_SITE_OTHER): Payer: Managed Care, Other (non HMO) | Admitting: Vascular Surgery

## 2017-09-08 ENCOUNTER — Encounter (INDEPENDENT_AMBULATORY_CARE_PROVIDER_SITE_OTHER): Payer: Self-pay | Admitting: Vascular Surgery

## 2017-09-08 VITALS — BP 146/97 | HR 86 | Resp 16 | Ht 69.0 in | Wt 236.0 lb

## 2017-09-08 DIAGNOSIS — I83813 Varicose veins of bilateral lower extremities with pain: Secondary | ICD-10-CM

## 2017-09-08 NOTE — Progress Notes (Signed)
Subjective:    Patient ID: Jennifer Mays, female    DOB: [birth year], 26 y.o.   MRN: [id number] Chief Complaint  Patient presents with  . New Patient (Initial Visit)    Varicose Veins   Presents as a new patient self-referred with a chief complaint of painful varicose veins.  The patient endorses a history of worsening and painful varicose veins after her two pregnancies.  The patient underwent a bilateral venous duplex approximately 5 years ago and was told that she has "venous reflux".  At the time, the patient was not able to afford undergoing endovenous laser ablation.  The patient notes a progressive worsening in the size and discomfort of her varicosities.  The patient notes her discomfort worsens towards the end of the day.  The patient denies any trauma or surgery to the bilateral lower extremity.  The patient denies any trauma surgery to the lower extremity.  At this time, the patient is not engaging in conservative therapy including wearing medical grade 1 compression stockings and elevating her legs.  The patient's symptoms have progressed to the point she is unable to function on a daily basis.  Her symptoms are lifestyle limiting.  The patient denies any fever, nausea or vomiting.   Review of Systems  Constitutional: Negative.   HENT: Negative.   Eyes: Negative.   Respiratory: Negative.   Cardiovascular:       Painful bilateral lower extremity varicose veins  Gastrointestinal: Negative.   Endocrine: Negative.   Genitourinary: Negative.   Musculoskeletal: Negative.   Skin: Negative.   Allergic/Immunologic: Negative.   Neurological: Negative.   Hematological: Negative.   Psychiatric/Behavioral: Negative.       Objective:   Physical Exam  Constitutional: She is oriented to person, place, and time. She appears well-developed and well-nourished. No distress.  HENT:  Head: Normocephalic and atraumatic.  Eyes: Conjunctivae are normal. Pupils are equal, round, and reactive  to light.  Neck: Normal range of motion.  Cardiovascular: Normal rate, regular rhythm, normal heart sounds and intact distal pulses.  Pulses:      Radial pulses are 2+ on the right side, and 2+ on the left side.       Dorsalis pedis pulses are 2+ on the right side, and 2+ on the left side.       Posterior tibial pulses are 2+ on the right side, and 2+ on the left side.  Pulmonary/Chest: Effort normal and breath sounds normal.  Musculoskeletal: Normal range of motion. She exhibits edema (Mild bilateral lower extremity nonpitting edema noted).  Neurological: She is alert and oriented to person, place, and time.  Skin: She is not diaphoretic.  Greater than 1 cm scattered varicosities to the bilateral lower extremity.  There is no stasis dermatitis.  There is no cellulitis.  There is no skin changes.  Psychiatric: She has a normal mood and affect. Her behavior is normal. Judgment and thought content normal.  Vitals reviewed.  BP (!) 146/97 (BP Location: Right Arm, Patient Position: Sitting)   Pulse 86   Resp 16   Ht 5\' 9"  (1.753 m)   Wt 236 lb (107 kg)   BMI 34.85 kg/m   Past Medical History:  Diagnosis Date  . Abnormal Pap smear and cervical HPV (human papillomavirus) 2007   s/p LEEP for cervical dysplasia  . Anxiety   . Generalized headaches   . History of chicken pox   . History of chlamydia 02/2012   treated  . HTN (hypertension)  07/31/2011  . Migraines   . Varicose vein 03/2012   saw [facility], rec compression stocking   Social History   Socioeconomic History  . Marital status: Married    Spouse name: Not on file  . Number of children: 2  . Years of education: Not on file  . Highest education level: Not on file  Social Needs  . Financial resource strain: Not on file  . Food insecurity - worry: Not on file  . Food insecurity - inability: Not on file  . Transportation needs - medical: Not on file  . Transportation needs - non-medical: Not on file  Occupational History  .  Occupation: Homemaker  Tobacco Use  . Smoking status: Former Smoker    Last attempt to quit: 10/07/2012    Years since quitting: 4.9  . Smokeless tobacco: Never Used  Substance and Sexual Activity  . Alcohol use: Yes    Alcohol/week: 1.2 oz    Types: 2 Glasses of wine per week    Comment: drinks on weekends  . Drug use: No  . Sexual activity: Yes    Partners: Male    Birth control/protection: IUD    Comment: Paraguard  Other Topics Concern  . Not on file  Social History Narrative   Caffeine: 1-2 cups coffee/am   Lives with husband, husband's friends, 1 dog   Studying psychology in [employer]   Activity: yoga/pilates, no other reg exercise   Diet: good fruits/vegetables, avoids fast foods/processed foods, good amt water   Past Surgical History:  Procedure Laterality Date  . INTRAUTERINE DEVICE INSERTION  10/22/2015  . LEEP  2007  . TONSILLECTOMY AND ADENOIDECTOMY  2002   Family History  Problem Relation Age of Onset  . Hypertension Father   . Asthma Father   . Coronary artery disease Paternal Grandmother [id number]       MI  . Hypertension Paternal Grandmother   . Diabetes Paternal Grandfather   . Stroke Paternal Grandfather   . Hypertension Paternal Grandfather   . Alcohol abuse Maternal Grandfather   . Asthma Sister   . Parkinsonism Maternal Aunt   . Hypertension Paternal Aunt   . Hypertension Paternal Uncle   . Cancer Neg Hx    Allergies  Allergen Reactions  . Sulfa Drugs Cross Reactors Hives  . Yaz [Drospirenone-Ethinyl Estradiol] Other (See Comments)    Headache      Assessment & Plan:  Presents as a new patient self-referred with a chief complaint of painful varicose veins.  The patient endorses a history of worsening and painful varicose veins after her two pregnancies.  The patient underwent a bilateral venous duplex approximately 5 years ago and was told that she has "venous reflux".  At the time, the patient was not able to afford undergoing endovenous laser  ablation.  The patient notes a progressive worsening in the size and discomfort of her varicosities.  The patient notes her discomfort worsens towards the end of the day.  The patient denies any trauma or surgery to the bilateral lower extremity.  The patient denies any trauma surgery to the lower extremity.  At this time, the patient is not engaging in conservative therapy including wearing medical grade 1 compression stockings and elevating her legs.  The patient's symptoms have progressed to the point she is unable to function on a daily basis.  Her symptoms are lifestyle limiting.  The patient denies any fever, nausea or vomiting.  1. Varicose veins of bilateral lower extremities with pain -  New The patient was encouraged to wear graduated compression stockings (20-30 mmHg) on a daily basis. The patient was instructed to begin wearing the stockings first thing in the morning and removing them in the evening. The patient was instructed specifically not to sleep in the stockings. Prescription given. In addition, behavioral modification including elevation during the day will be initiated. Anti-inflammatories for pain. The patient is likely to benefit from endovenous laser ablation. I have discussed the risks and benefits of the procedure. The risks primarily include DVT, recanalization, bleeding, infection, and inability to gain access. The patient will follow up in three months to asses conservative management.  Information on chronic venous insufficiency and compression stockings was given to the patient. The patient was instructed to call the office in the interim if any worsening edema or ulcerations to the legs, feet or toes occurs. The patient expresses their understanding.  - VAS US LOWER EXTREMITY VENOUS REFLUX; Future  No current outpatient medications on file prior to visit.   No current facility-administered medications on file prior to visit.     There are no Patient Instructions on  file for this visit. No Follow-up on file.   Shalece Staffa A Juliocesar Blasius, PA-C

## 2017-12-08 ENCOUNTER — Ambulatory Visit (INDEPENDENT_AMBULATORY_CARE_PROVIDER_SITE_OTHER): Payer: Managed Care, Other (non HMO) | Admitting: Vascular Surgery

## 2017-12-08 ENCOUNTER — Encounter (INDEPENDENT_AMBULATORY_CARE_PROVIDER_SITE_OTHER): Payer: Managed Care, Other (non HMO)

## 2018-01-08 ENCOUNTER — Ambulatory Visit (INDEPENDENT_AMBULATORY_CARE_PROVIDER_SITE_OTHER): Payer: Managed Care, Other (non HMO) | Admitting: Vascular Surgery

## 2018-01-08 ENCOUNTER — Ambulatory Visit (INDEPENDENT_AMBULATORY_CARE_PROVIDER_SITE_OTHER): Payer: Managed Care, Other (non HMO)

## 2018-01-08 DIAGNOSIS — I83813 Varicose veins of bilateral lower extremities with pain: Secondary | ICD-10-CM

## 2018-01-12 ENCOUNTER — Telehealth (INDEPENDENT_AMBULATORY_CARE_PROVIDER_SITE_OTHER): Payer: Self-pay | Admitting: Vascular Surgery

## 2018-01-12 NOTE — Telephone Encounter (Signed)
Contacted the patient by herself phone.  I did leave a message saying that I had called but did not leave results on the phone as her voicemail does not identify her although the telephone number does not match with the one on record.  I will try to call her again in the next day or 2.

## 2018-06-16 ENCOUNTER — Telehealth (INDEPENDENT_AMBULATORY_CARE_PROVIDER_SITE_OTHER): Payer: Self-pay

## 2018-06-17 NOTE — Telephone Encounter (Signed)
I attempted to call the patient several times and leave a message but the voicemail was not working properly

## 2018-06-17 NOTE — Telephone Encounter (Signed)
If the patient only has a bruise, this should resolve on its own.  A bruise can take a few days to maybe a week or so to resolve.  If the patient notices any worsening pain to the area, erythema or ulceration of her skin then she should come in to be seen.

## 2018-11-09 ENCOUNTER — Encounter: Payer: Self-pay | Admitting: Family Medicine

## 2018-11-09 ENCOUNTER — Ambulatory Visit: Payer: Managed Care, Other (non HMO) | Admitting: Family Medicine

## 2018-11-09 VITALS — BP 138/100 | HR 98 | Temp 97.6°F | Ht 69.5 in | Wt 236.0 lb

## 2018-11-09 DIAGNOSIS — I10 Essential (primary) hypertension: Secondary | ICD-10-CM

## 2018-11-09 DIAGNOSIS — Z6835 Body mass index (BMI) 35.0-35.9, adult: Secondary | ICD-10-CM | POA: Diagnosis not present

## 2018-11-09 DIAGNOSIS — E669 Obesity, unspecified: Secondary | ICD-10-CM | POA: Insufficient documentation

## 2018-11-09 DIAGNOSIS — R0789 Other chest pain: Secondary | ICD-10-CM | POA: Diagnosis not present

## 2018-11-09 MED ORDER — AMLODIPINE BESYLATE 5 MG PO TABS: 5.0000 mg | ORAL_TABLET | Freq: Every day | ORAL | 3 refills | Status: DC

## 2018-11-09 NOTE — Progress Notes (Signed)
BP (!) 138/100 (BP Location: Right Arm, Cuff Size: Large)   Pulse 98   Temp 97.6 F (36.4 C) (Oral)   Ht 5' 9.5" (1.765 m)   Wt 236 lb (107 kg)   LMP 11/08/2018   SpO2 99%   BMI 34.35 kg/m    CC: chest pain Subjective:    Patient ID: Jennifer Mays, female    DOB: Oct 23, 1990, 28 y.o.   MRN: [id number]  HPI: Jennifer Mays is a 28 y.o. female presenting on 11/09/2018 for Breast Pain (chest pain, some anxiety, heaviness in chest)   Last seen 10/2014.   Last month started noticing L sided chest heaviness.  This past Friday may have had a panic attack - dizzy, dyspneic associated with chest discomfort.  Has tried more supportive bras as well as chest stretching. Advil and tylenol haven't helped.   Notes some elevated BP readings at home. No HA, vision changes, SOB, leg swelling.   Strong fmhx HTN.  Endorses home pulse 60s.   LMP - currently on period.  Persistent GERD since pregnancy well controlled on daily omeprazole 20mg .  Endorses h/o arthritis - hands and ankles swell at times - no redness or warmth. Grandmother had RA.   Going to school for education.      Relevant past medical, surgical, family and social history reviewed and updated as indicated. Interim medical history since our last visit reviewed. Allergies and medications reviewed and updated. Outpatient Medications Prior to Visit  Medication Sig Dispense Refill  . omeprazole (PRILOSEC OTC) 20 MG tablet Take 20 mg by mouth daily.     No facility-administered medications prior to visit.      Per HPI unless specifically indicated in ROS section below Review of Systems Objective:    BP (!) 138/100 (BP Location: Right Arm, Cuff Size: Large)   Pulse 98   Temp 97.6 F (36.4 C) (Oral)   Ht 5' 9.5" (1.765 m)   Wt 236 lb (107 kg)   LMP 11/08/2018   SpO2 99%   BMI 34.35 kg/m   Wt Readings from Last 3 Encounters:  11/09/18 236 lb (107 kg)  09/08/17 236 lb (107 kg)  05/08/17 231 lb (104.8 kg)      Physical Exam Vitals signs and nursing note reviewed.  Constitutional:      Appearance: Normal appearance. She is not ill-appearing.  HENT:     Head: Normocephalic and atraumatic.  Cardiovascular:     Rate and Rhythm: Normal rate and regular rhythm.     Pulses: Normal pulses.     Heart sounds: Normal heart sounds. No murmur.  Pulmonary:     Effort: Pulmonary effort is normal. No respiratory distress.     Breath sounds: Normal breath sounds. No wheezing, rhonchi or rales.  Chest:     Chest wall: Tenderness (L 2nd costochondral junction) present.  Musculoskeletal: Normal range of motion.     Right lower leg: No edema.     Left lower leg: No edema.  Neurological:     Mental Status: She is alert.  Psychiatric:        Mood and Affect: Mood normal.       Results for orders placed or performed in visit on 05/08/17  CBC with Differential/Platelet  Result Value Ref Range   WBC 8.7 3.4 - 10.8 x10E3/uL   RBC 4.71 3.77 - 5.28 x10E6/uL   Hemoglobin 14.4 11.1 - 15.9 g/dL   Hematocrit 76.1 60.7 - 46.6 %   MCV  92 79 - 97 fL   MCH 30.6 26.6 - 33.0 pg   MCHC 33.3 31.5 - 35.7 g/dL   RDW 93.2 67.1 - 24.5 %   Platelets 270 150 - 379 x10E3/uL   Neutrophils 60 Not Estab. %   Lymphs 30 Not Estab. %   Monocytes 10 Not Estab. %   Eos 0 Not Estab. %   Basos 0 Not Estab. %   Neutrophils Absolute 5.2 1.4 - 7.0 x10E3/uL   Lymphocytes Absolute 2.6 0.7 - 3.1 x10E3/uL   Monocytes Absolute 0.9 0.1 - 0.9 x10E3/uL   EOS (ABSOLUTE) 0.0 0.0 - 0.4 x10E3/uL   Basophils Absolute 0.0 0.0 - 0.2 x10E3/uL   Immature Granulocytes 0 Not Estab. %   Immature Grans (Abs) 0.0 0.0 - 0.1 x10E3/uL  Lipid Panel With LDL/HDL Ratio  Result Value Ref Range   Cholesterol, Total 205 (H) 100 - 199 mg/dL   Triglycerides 809 (H) 0 - 149 mg/dL   HDL 69 >98 mg/dL   VLDL Cholesterol Cal 35 5 - 40 mg/dL   LDL Calculated 338 (H) 0 - 99 mg/dL   LDl/HDL Ratio 1.5 0.0 - 3.2 ratio  Hemoglobin A1c  Result Value Ref Range   Hgb  A1c MFr Bld 4.9 4.8 - 5.6 %   Est. average glucose Bld gHb Est-mCnc 94 mg/dL  TSH  Result Value Ref Range   TSH 1.720 0.450 - 4.500 uIU/mL   Assessment & Plan:   Problem List Items Addressed This Visit    Severe obesity (BMI 35.0-35.9 with comorbidity) (HCC)    Pt motivated to work on weight loss to help control HTN.      HTN (hypertension) - Primary    BP elevated today as well as at home - will start amlodipine 5mg  daily. HTN handout provided, DASH diet provided. Reassess at CPE in 1-2 months. Pt motivated to work on weight loss to help control HTN.      Relevant Medications   amLODipine (NORVASC) 5 MG tablet   Chest pain    Anticipate MSK cause possible costochondritis.  rec advil, heating pad. Update with effect.           Meds ordered this encounter  Medications  . amLODipine (NORVASC) 5 MG tablet    Sig: Take 1 tablet (5 mg total) by mouth daily.    Dispense:  30 tablet    Refill:  3   No orders of the defined types were placed in this encounter.   Follow up plan: Return in about 1 month (around 12/08/2018) for annual exam, prior fasting for blood work.  Eustaquio Boyden, MD

## 2018-11-09 NOTE — Assessment & Plan Note (Signed)
Pt motivated to work on weight loss to help control HTN.

## 2018-11-09 NOTE — Assessment & Plan Note (Addendum)
BP elevated today as well as at home - will start amlodipine 5mg  daily. HTN handout provided, DASH diet provided. Reassess at CPE in 1-2 months. Pt motivated to work on weight loss to help control HTN.

## 2018-11-09 NOTE — Patient Instructions (Addendum)
Blood pressure is staying elevated - start amlodipine 5mg  daily sent to pharmacy. Return in 2 months for follow up visit.  Your goal blood pressure is <140/90, ideally <130/80. Work on low salt/sodium diet - goal <1.5gm (1,500mg ) per day. Eat a diet high in fruits/vegetables and whole grains.  Look into mediterranean and DASH diet. Goal activity is 11450min/wk of moderate intensity exercise.  This can be split into 30 minute chunks.  If you are not at this level, you can start with smaller 10-15 min increments and slowly build up activity. Look at www.heart.org for more resources  For chest pain - may be costochondritis - treat with heating pad and advil 2-3 tablets with meals for next few days.    DASH Eating Plan DASH stands for "Dietary Approaches to Stop Hypertension." The DASH eating plan is a healthy eating plan that has been shown to reduce high blood pressure (hypertension). It may also reduce your risk for type 2 diabetes, heart disease, and stroke. The DASH eating plan may also help with weight loss. What are tips for following this plan?  General guidelines  Avoid eating more than 2,300 mg (milligrams) of salt (sodium) a day. If you have hypertension, you may need to reduce your sodium intake to 1,500 mg a day.  Limit alcohol intake to no more than 1 drink a day for nonpregnant women and 2 drinks a day for men. One drink equals 12 oz of beer, 5 oz of wine, or 1 oz of hard liquor.  Work with your health care provider to maintain a healthy body weight or to lose weight. Ask what an ideal weight is for you.  Get at least 30 minutes of exercise that causes your heart to beat faster (aerobic exercise) most days of the week. Activities may include walking, swimming, or biking.  Work with your health care provider or diet and nutrition specialist (dietitian) to adjust your eating plan to your individual calorie needs. Reading food labels   Check food labels for the amount of sodium per  serving. Choose foods with less than 5 percent of the Daily Value of sodium. Generally, foods with less than 300 mg of sodium per serving fit into this eating plan.  To find whole grains, look for the word "whole" as the first word in the ingredient list. Shopping  Buy products labeled as "low-sodium" or "no salt added."  Buy fresh foods. Avoid canned foods and premade or frozen meals. Cooking  Avoid adding salt when cooking. Use salt-free seasonings or herbs instead of table salt or sea salt. Check with your health care provider or pharmacist before using salt substitutes.  Do not fry foods. Cook foods using healthy methods such as baking, boiling, grilling, and broiling instead.  Cook with heart-healthy oils, such as olive, canola, soybean, or sunflower oil. Meal planning  Eat a balanced diet that includes: ? 5 or more servings of fruits and vegetables each day. At each meal, try to fill half of your plate with fruits and vegetables. ? Up to 6-8 servings of whole grains each day. ? Less than 6 oz of lean meat, poultry, or fish each day. A 3-oz serving of meat is about the same size as a deck of cards. One egg equals 1 oz. ? 2 servings of low-fat dairy each day. ? A serving of nuts, seeds, or beans 5 times each week. ? Heart-healthy fats. Healthy fats called Omega-3 fatty acids are found in foods such as flaxseeds and coldwater  fish, like sardines, salmon, and mackerel.  Limit how much you eat of the following: ? Canned or prepackaged foods. ? Food that is high in trans fat, such as fried foods. ? Food that is high in saturated fat, such as fatty meat. ? Sweets, desserts, sugary drinks, and other foods with added sugar. ? Full-fat dairy products.  Do not salt foods before eating.  Try to eat at least 2 vegetarian meals each week.  Eat more home-cooked food and less restaurant, buffet, and fast food.  When eating at a restaurant, ask that your food be prepared with less salt or  no salt, if possible. What foods are recommended? The items listed may not be a complete list. Talk with your dietitian about what dietary choices are best for you. Grains Whole-grain or whole-wheat bread. Whole-grain or whole-wheat pasta. Brown rice. [place]. Bulgur. Whole-grain and low-sodium cereals. Pita bread. Low-fat, low-sodium crackers. Whole-wheat flour tortillas. Vegetables Fresh or frozen vegetables (raw, steamed, roasted, or grilled). Low-sodium or reduced-sodium tomato and vegetable juice. Low-sodium or reduced-sodium tomato sauce and tomato paste. Low-sodium or reduced-sodium canned vegetables. Fruits All fresh, dried, or frozen fruit. Canned fruit in natural juice (without added sugar). Meat and other protein foods Skinless chicken or [place]. Ground chicken or [place]. Pork with fat trimmed off. Fish and seafood. Egg whites. Dried beans, peas, or lentils. Unsalted nuts, nut butters, and seeds. Unsalted canned beans. Lean cuts of beef with fat trimmed off. Low-sodium, lean deli meat. Dairy Low-fat (1%) or fat-free (skim) milk. Fat-free, low-fat, or reduced-fat cheeses. Nonfat, low-sodium ricotta or cottage cheese. Low-fat or nonfat yogurt. Low-fat, low-sodium cheese. Fats and oils Soft margarine without trans fats. Vegetable oil. Low-fat, reduced-fat, or light mayonnaise and salad dressings (reduced-sodium). Canola, safflower, olive, soybean, and sunflower oils. Avocado. Seasoning and other foods Herbs. Spices. Seasoning mixes without salt. Unsalted popcorn and pretzels. Fat-free sweets. What foods are not recommended? The items listed may not be a complete list. Talk with your dietitian about what dietary choices are best for you. Grains Baked goods made with fat, such as croissants, muffins, or some breads. Dry pasta or rice meal packs. Vegetables Creamed or fried vegetables. Vegetables in a cheese sauce. Regular canned vegetables (not low-sodium or reduced-sodium).  Regular canned tomato sauce and paste (not low-sodium or reduced-sodium). Regular tomato and vegetable juice (not low-sodium or reduced-sodium). Angie Fava. Olives. Fruits Canned fruit in a light or heavy syrup. Fried fruit. Fruit in cream or butter sauce. Meat and other protein foods Fatty cuts of meat. Ribs. Fried meat. Berniece Salines. Sausage. Bologna and other processed lunch meats. Salami. Fatback. Hotdogs. Bratwurst. Salted nuts and seeds. Canned beans with added salt. Canned or smoked fish. Whole eggs or egg yolks. Chicken or [place] with skin. Dairy Whole or 2% milk, cream, and half-and-half. Whole or full-fat cream cheese. Whole-fat or sweetened yogurt. Full-fat cheese. Nondairy creamers. Whipped toppings. Processed cheese and cheese spreads. Fats and oils Butter. Stick margarine. Lard. Shortening. Ghee. Bacon fat. Tropical oils, such as coconut, palm kernel, or palm oil. Seasoning and other foods Salted popcorn and pretzels. Onion salt, garlic salt, seasoned salt, table salt, and sea salt. Worcestershire sauce. Tartar sauce. Barbecue sauce. Teriyaki sauce. Soy sauce, including reduced-sodium. Steak sauce. Canned and packaged gravies. Fish sauce. Oyster sauce. Cocktail sauce. Horseradish that you find on the shelf. Ketchup. Mustard. Meat flavorings and tenderizers. Bouillon cubes. Hot sauce and Tabasco sauce. Premade or packaged marinades. Premade or packaged taco seasonings. Relishes. Regular salad dressings. Where to find more  information:  National Heart, Lung, and Blood Institute: PopSteam.iswww.nhlbi.nih.gov  American Heart Association: www.heart.org Summary  The DASH eating plan is a healthy eating plan that has been shown to reduce high blood pressure (hypertension). It may also reduce your risk for type 2 diabetes, heart disease, and stroke.  With the DASH eating plan, you should limit salt (sodium) intake to 2,300 mg a day. If you have hypertension, you may need to reduce your sodium intake to 1,500 mg  a day.  When on the DASH eating plan, aim to eat more fresh fruits and vegetables, whole grains, lean proteins, low-fat dairy, and heart-healthy fats.  Work with your health care provider or diet and nutrition specialist (dietitian) to adjust your eating plan to your individual calorie needs. This information is not intended to replace advice given to you by your health care provider. Make sure you discuss any questions you have with your health care provider. Document Released: 09/12/2011 Document Revised: 09/16/2016 Document Reviewed: 09/16/2016 Elsevier Interactive Patient Education  2019 [employer]

## 2018-11-09 NOTE — Assessment & Plan Note (Signed)
Anticipate MSK cause possible costochondritis.  rec advil, heating pad. Update with effect.

## 2018-11-30 ENCOUNTER — Ambulatory Visit (INDEPENDENT_AMBULATORY_CARE_PROVIDER_SITE_OTHER): Payer: Managed Care, Other (non HMO) | Admitting: Vascular Surgery

## 2018-11-30 ENCOUNTER — Other Ambulatory Visit: Payer: Self-pay

## 2018-11-30 ENCOUNTER — Encounter (INDEPENDENT_AMBULATORY_CARE_PROVIDER_SITE_OTHER): Payer: Self-pay | Admitting: Vascular Surgery

## 2018-11-30 VITALS — BP 147/107 | HR 79 | Resp 10 | Ht 69.5 in | Wt 237.0 lb

## 2018-11-30 DIAGNOSIS — I83813 Varicose veins of bilateral lower extremities with pain: Secondary | ICD-10-CM

## 2018-11-30 DIAGNOSIS — I872 Venous insufficiency (chronic) (peripheral): Secondary | ICD-10-CM | POA: Diagnosis not present

## 2018-11-30 NOTE — Progress Notes (Signed)
Subjective:    Patient ID: Jennifer Mays, female    DOB: 12/06/1990, 28 y.o.   MRN: [id number] Chief Complaint  Patient presents with  . Follow-up    test results    Patient presents to review vascular studies.  The patient was originally seen on 09/08/17 for evaluation of bilateral lower extremity edema / varicose vein discomfort.  Since her initial visit, the patient has been engaging conservative therapy including wearing medical grade 1 compression socks, elevating her legs and remaining active.  This has provided no improvement to her symptoms.  The patient notes that her symptoms have progressed to the point that she is unable to function on a daily basis and they have become lifestyle limiting.  The patient underwent a bilateral lower extremity venous duplex which was notable for: Right: Abnormal reflux times are noted in the popliteal vein, great saphenous vein at the groin distally to the proximal calf. Left: Abnormal reflux times are noted in the great saphenous vein at the knee distally to the proximal calf. There is no evidence of DVT or SVT bilaterally. The patient denies any fever, nausea vomiting.  Review of Systems  Constitutional: Negative.   HENT: Negative.   Eyes: Negative.   Respiratory: Negative.   Cardiovascular: Positive for leg swelling.       Painful varicose veins  Gastrointestinal: Negative.   Endocrine: Negative.   Genitourinary: Negative.   Musculoskeletal: Negative.   Skin: Negative.   Allergic/Immunologic: Negative.   Neurological: Negative.   Hematological: Negative.   Psychiatric/Behavioral: Negative.       Objective:   Physical Exam Vitals signs reviewed.  Constitutional:      Appearance: Normal appearance. She is normal weight.  HENT:     Head: Normocephalic and atraumatic.     Right Ear: External ear normal.     Left Ear: External ear normal.     Nose: Nose normal.     Mouth/Throat:     Mouth: Mucous membranes are moist.   Pharynx: Oropharynx is clear.  Eyes:     Extraocular Movements: Extraocular movements intact.     Conjunctiva/sclera: Conjunctivae normal.     Pupils: Pupils are equal, round, and reactive to light.  Neck:     Musculoskeletal: Normal range of motion.  Cardiovascular:     Rate and Rhythm: Normal rate and regular rhythm.     Pulses: Normal pulses.     Heart sounds: Normal heart sounds.  Pulmonary:     Effort: Pulmonary effort is normal.     Breath sounds: Normal breath sounds.  Musculoskeletal: Normal range of motion.        General: Swelling (mild to moderate swelling bilaterally) present.  Skin:    Comments: Mixture of >2cm and <2cm varicosities to the bilateral legs  Neurological:     General: No focal deficit present.     Mental Status: She is alert and oriented to person, place, and time. Mental status is at baseline.  Psychiatric:        Mood and Affect: Mood normal.        Behavior: Behavior normal.        Thought Content: Thought content normal.        Judgment: Judgment normal.    BP (!) 147/107 (BP Location: Left Arm, Patient Position: Sitting, Cuff Size: Large)   Pulse 79   Resp 10   Ht 5' 9.5" (1.765 m)   Wt 237 lb (107.5 kg)   LMP 11/08/2018  BMI 34.50 kg/m    Past Medical History:  Diagnosis Date  . Abnormal Pap smear and cervical HPV (human papillomavirus) 2007   s/p LEEP for cervical dysplasia  . Anxiety   . Generalized headaches   . History of chicken pox   . History of chlamydia 02/2012   treated  . HTN (hypertension) 07/31/2011  . Migraines   . Varicose vein 03/2012   saw [facility], rec compression stocking   Social History   Socioeconomic History  . Marital status: Married    Spouse name: Not on file  . Number of children: 2  . Years of education: Not on file  . Highest education level: Not on file  Occupational History  . Occupation: Futures traderHomemaker  Social Needs  . Financial resource strain: Not on file  . Food insecurity:    Worry: Not on file      Inability: Not on file  . Transportation needs:    Medical: Not on file    Non-medical: Not on file  Tobacco Use  . Smoking status: Former Smoker    Last attempt to quit: 10/07/2012    Years since quitting: 6.1  . Smokeless tobacco: Never Used  Substance and Sexual Activity  . Alcohol use: Yes    Alcohol/week: 2.0 standard drinks    Types: 2 Glasses of wine per week    Comment: drinks on weekends  . Drug use: No  . Sexual activity: Yes    Partners: Male    Birth control/protection: I.U.D.    Comment: Paraguard  Lifestyle  . Physical activity:    Days per week: Not on file    Minutes per session: Not on file  . Stress: Not on file  Relationships  . Social connections:    Talks on phone: Not on file    Gets together: Not on file    Attends religious service: Not on file    Active member of club or organization: Not on file    Attends meetings of clubs or organizations: Not on file    Relationship status: Not on file  . Intimate partner violence:    Fear of current or ex partner: Not on file    Emotionally abused: Not on file    Physically abused: Not on file    Forced sexual activity: Not on file  Other Topics Concern  . Not on file  Social History Narrative   Caffeine: 1-2 cups coffee/am   Lives with husband, husband's friends, 1 dog   Studying psychology in [employer]   Activity: yoga/pilates, no other reg exercise   Diet: good fruits/vegetables, avoids fast foods/processed foods, good amt water   Past Surgical History:  Procedure Laterality Date  . INTRAUTERINE DEVICE INSERTION  10/22/2015  . LEEP  2007  . TONSILLECTOMY AND ADENOIDECTOMY  2002   Family History  Problem Relation Age of Onset  . Hypertension Father   . Asthma Father   . Coronary artery disease Paternal Grandmother [id number]       MI  . Hypertension Paternal Grandmother   . Diabetes Paternal Grandfather   . Stroke Paternal Grandfather   . Hypertension Paternal Grandfather   . Alcohol abuse  Maternal Grandfather   . Asthma Sister   . Parkinsonism Maternal Aunt   . Hypertension Paternal Aunt   . Hypertension Paternal Uncle   . Cancer Neg Hx    Allergies  Allergen Reactions  . Sulfa Drugs Cross Reactors Hives  . Yaz [Drospirenone-Ethinyl Estradiol] Other (See  Comments)    Headache      Assessment & Plan:   Patient presents to review vascular studies.  The patient was originally seen on 09/08/17 for evaluation of bilateral lower extremity edema / varicose vein discomfort.  Since her initial visit, the patient has been engaging conservative therapy including wearing medical grade 1 compression socks, elevating her legs and remaining active.  This has provided no improvement to her symptoms.  The patient notes that her symptoms have progressed to the point that she is unable to function on a daily basis and they have become lifestyle limiting.  The patient underwent a bilateral lower extremity venous duplex which was notable for: Right: Abnormal reflux times are noted in the popliteal vein, great saphenous vein at the groin distally to the proximal calf. Left: Abnormal reflux times are noted in the great saphenous vein at the knee distally to the proximal calf. There is no evidence of DVT or SVT bilaterally. The patient denies any fever, nausea vomiting.  1. Chronic venous insufficiency - New Study reviewed with patient. I have discussed chronic venous insufficiency with the patient, why it causes symptoms and how to manage them.  The patient was encouraged to continue wearing graduated compression stockings (20-30 mmHg) on a daily basis. The patient was instructed to begin wearing the stockings first thing in the morning and removing them in the evening. The patient was instructed specifically not to sleep in the stockings.  Elevation during the day will be continued. The patient is likely to benefit from endovenous laser ablation. I have discussed the risks and benefits of the  procedure. The risks primarily include DVT, recanalization, bleeding, infection, and inability to gain access. We will call with insurance approval  The patient was instructed to call the office in the interim if any worsening edema or ulcerations to the legs, feet or toes occurs. The patient expresses their understanding.  2. Varicose veins of bilateral lower extremities with pain - Stable As above  Current Outpatient Medications on File Prior to Visit  Medication Sig Dispense Refill  . amLODipine (NORVASC) 5 MG tablet Take 1 tablet (5 mg total) by mouth daily. 30 tablet 3  . omeprazole (PRILOSEC OTC) 20 MG tablet Take 20 mg by mouth daily.     No current facility-administered medications on file prior to visit.    There are no Patient Instructions on file for this visit. No follow-ups on file.  Saif [employer], PA-C

## 2018-12-01 ENCOUNTER — Other Ambulatory Visit: Payer: Managed Care, Other (non HMO)

## 2018-12-01 ENCOUNTER — Ambulatory Visit: Payer: Managed Care, Other (non HMO) | Admitting: Family Medicine

## 2018-12-01 ENCOUNTER — Encounter: Payer: Self-pay | Admitting: Family Medicine

## 2018-12-01 VITALS — BP 134/98 | HR 84 | Temp 98.4°F | Ht 69.5 in | Wt 232.4 lb

## 2018-12-01 DIAGNOSIS — E785 Hyperlipidemia, unspecified: Secondary | ICD-10-CM

## 2018-12-01 DIAGNOSIS — I10 Essential (primary) hypertension: Secondary | ICD-10-CM | POA: Diagnosis not present

## 2018-12-01 DIAGNOSIS — R0789 Other chest pain: Secondary | ICD-10-CM | POA: Diagnosis not present

## 2018-12-01 LAB — MICROALBUMIN / CREATININE URINE RATIO
CREATININE, U: 403.3 mg/dL
MICROALB UR: 4.7 mg/dL — AB (ref 0.0–1.9)
Microalb Creat Ratio: 1.2 mg/g (ref 0.0–30.0)

## 2018-12-01 LAB — LIPID PANEL
CHOL/HDL RATIO: 3
CHOLESTEROL: 228 mg/dL — AB (ref 0–200)
HDL: 84 mg/dL (ref >39.00)
NONHDL: 143.73
Triglycerides: 284 mg/dL — ABNORMAL HIGH (ref 0.0–149.0)
VLDL: 56.8 mg/dL — AB (ref 0.0–40.0)

## 2018-12-01 LAB — COMPREHENSIVE METABOLIC PANEL
ALT: 25 U/L (ref 0–35)
AST: 27 U/L (ref 0–37)
Albumin: 4.7 g/dL (ref 3.5–5.2)
Alkaline Phosphatase: 44 U/L (ref 39–117)
BUN: 12 mg/dL (ref 6–23)
CALCIUM: 9.4 mg/dL (ref 8.4–10.5)
CHLORIDE: 101 meq/L (ref 96–112)
CO2: 23 meq/L (ref 19–32)
CREATININE: 0.82 mg/dL (ref 0.40–1.20)
GFR: 82.21 mL/min (ref >60.00)
GLUCOSE: 107 mg/dL — AB (ref 70–99)
Potassium: 3.3 mEq/L — ABNORMAL LOW (ref 3.5–5.1)
Sodium: 135 mEq/L (ref 135–145)
Total Bilirubin: 1.7 mg/dL — ABNORMAL HIGH (ref 0.2–1.2)
Total Protein: 7.6 g/dL (ref 6.0–8.3)

## 2018-12-01 LAB — LDL CHOLESTEROL, DIRECT: LDL DIRECT: 128 mg/dL

## 2018-12-01 LAB — TSH: TSH: 2.34 u[IU]/mL (ref 0.35–4.50)

## 2018-12-01 MED ORDER — AMLODIPINE BESY-BENAZEPRIL HCL 5-10 MG PO CAPS
1.0000 | ORAL_CAPSULE | Freq: Every day | ORAL | 6 refills | Status: DC
Start: 2018-12-01 — End: 2019-04-22

## 2018-12-01 NOTE — Assessment & Plan Note (Signed)
Update FLP as fasting today.  ?

## 2018-12-01 NOTE — Progress Notes (Signed)
BP (!) 134/98 (BP Location: Right Arm, Patient Position: Sitting, Cuff Size: Large)   Pulse 84   Temp 98.4 F (36.9 C) (Oral)   Ht 5' 9.5" (1.765 m)   Wt 232 lb 6 oz (105.4 kg)   LMP 11/08/2018   SpO2 98%   BMI 33.82 kg/m    CC: f/u HTN Subjective:    Patient ID: Jennifer Mays, female    DOB: [birth year], 28 y.o.   MRN: [id number]  HPI: JET DIETIKER is a 28 y.o. female presenting on 12/01/2018 for Hypertension (Conerned about elevated BP (147/107) at [facility] yesterday. Taken again at end of visit 154/106. Not sure if it was anxiety related. Told to see her PCP.  Pt took again at home 120s/90s. ) and Chest Pain (Was seen previously for chest tightness. Tried heating pad and ibuprofen as suggested. Heating pad is helpful. )   See prior note for details. Last visit started on amlodipine 5mg  daily due to persistently elevated BP readings at home. We also reviewed DASH diet. Chest pain thought MSK, possible costochondritis - heating pad and ibuprofen has helped. Home BP readings systolic well controlled, diastolic remain elevated in the 90s.  Saw [facility] yesterday with again elevated BP readings to 147/107. She was also found to have bilateral venous reflux, considering treatment for this.   Denies vision changes, SOB, leg swelling.   Birth control - paraguard in place since 10/2015.      Relevant past medical, surgical, family and social history reviewed and updated as indicated. Interim medical history since our last visit reviewed. Allergies and medications reviewed and updated. Outpatient Medications Prior to Visit  Medication Sig Dispense Refill  . amLODipine (NORVASC) 5 MG tablet Take 1 tablet (5 mg total) by mouth daily. 30 tablet 3  . omeprazole (PRILOSEC OTC) 20 MG tablet Take 20 mg by mouth daily.     No facility-administered medications prior to visit.      Per HPI unless specifically indicated in ROS section below Review of Systems Objective:      BP (!) 134/98 (BP Location: Right Arm, Patient Position: Sitting, Cuff Size: Large)   Pulse 84   Temp 98.4 F (36.9 C) (Oral)   Ht 5' 9.5" (1.765 m)   Wt 232 lb 6 oz (105.4 kg)   LMP 11/08/2018   SpO2 98%   BMI 33.82 kg/m   Wt Readings from Last 3 Encounters:  12/01/18 232 lb 6 oz (105.4 kg)  11/30/18 237 lb (107.5 kg)  11/09/18 236 lb (107 kg)    Physical Exam Vitals signs and nursing note reviewed.  Constitutional:      Appearance: Normal appearance. She is well-developed. She is not ill-appearing.  Cardiovascular:     Rate and Rhythm: Normal rate and regular rhythm.     Pulses: Normal pulses.     Heart sounds: Normal heart sounds. No murmur.  Pulmonary:     Effort: Pulmonary effort is normal. No respiratory distress.     Breath sounds: Normal breath sounds. No wheezing, rhonchi or rales.  Chest:     Chest wall: Tenderness (mid upper sternal and L 2nd costochondral reproducible to palpation) present.  Neurological:     Mental Status: She is alert.  Psychiatric:        Mood and Affect: Mood is anxious.      Lab Results  Component Value Date   CREATININE 0.51 (L) 09/06/2013   BUN 8 09/06/2013   NA 135 (  L) 09/06/2013   K 3.8 09/06/2013   CL 106 09/06/2013   CO2 24 09/06/2013    EKG - NSR rate 80s, LAD, normal intervals, no acute ST/T changes Assessment & Plan:   Problem List Items Addressed This Visit    Hyperlipidemia    Update FLP as fasting today.       Relevant Medications   amLODipine-benazepril (LOTREL) 5-10 MG capsule   HTN (hypertension) - Primary    Baseline EKG today.  BP remaining elevated despite 3 weeks of amlodipine 5mg  daily. Will change to lotrel 5/10mg  daily. Reassess at CPE next month. Pt agrees with plan.       Relevant Medications   amLODipine-benazepril (LOTREL) 5-10 MG capsule   Other Relevant Orders   EKG 12-Lead (Completed)   Lipid panel   Comprehensive metabolic panel   TSH   Microalbumin / creatinine urine ratio   Chest  pain    Still consistent with MSK likely costochondritis.  rec continue NSAID, heating pad, handout provided today.           Meds ordered this encounter  Medications  . amLODipine-benazepril (LOTREL) 5-10 MG capsule    Sig: Take 1 capsule by mouth daily.    Dispense:  30 capsule    Refill:  6    In place of plain amlodipine   Orders Placed This Encounter  Procedures  . Lipid panel  . Comprehensive metabolic panel  . TSH  . Microalbumin / creatinine urine ratio  . EKG 12-Lead    Follow up plan: No follow-ups on file.  Eustaquio Boyden, MD

## 2018-12-01 NOTE — Patient Instructions (Addendum)
EKG today.  As blood pressure remaining elevated, start amlodipine/benazepril combo medication (lotrel) sent to pharmacy. No pregnancy while on this medicine. Ensure good hydration status while on this medicine.  We will reassess control at your physical.   Costochondritis  Costochondritis is swelling and irritation (inflammation) of the tissue (cartilage) that connects your ribs to your breastbone (sternum). This causes pain in the front of your chest. The pain usually starts gradually and involves more than one rib. What are the causes? The exact cause of this condition is not always known. It results from stress on the cartilage where your ribs attach to your sternum. The cause of this stress could be:  Chest injury (trauma).  Exercise or activity, such as lifting.  Severe coughing. What increases the risk? You may be at higher risk for this condition if you:  Are female.  Are 30?30 years old.  Recently started a new exercise or work activity.  Have low levels of vitamin D.  Have a condition that makes you cough frequently. What are the signs or symptoms? The main symptom of this condition is chest pain. The pain:  Usually starts gradually and can be sharp or dull.  Gets worse with deep breathing, coughing, or exercise.  Gets better with rest.  May be worse when you press on the sternum-rib connection (tenderness). How is this diagnosed? This condition is diagnosed based on your symptoms, medical history, and a physical exam. Your health care provider will check for tenderness when pressing on your sternum. This is the most important finding. You may also have tests to rule out other causes of chest pain. These may include:  A chest X-ray to check for lung problems.  An electrocardiogram (ECG) to see if you have a heart problem that could be causing the pain.  An imaging scan to rule out a chest or rib fracture. How is this treated? This condition usually goes away on  its own over time. Your health care provider may prescribe an NSAID to reduce pain and inflammation. Your health care provider may also suggest that you:  Rest and avoid activities that make pain worse.  Apply heat or cold to the area to reduce pain and inflammation.  Do exercises to stretch your chest muscles. If these treatments do not help, your health care provider may inject a numbing medicine at the sternum-rib connection to help relieve the pain. Follow these instructions at home:  Avoid activities that make pain worse. This includes any activities that use chest, abdominal, and side muscles.  If directed, put ice on the painful area: ? Put ice in a plastic bag. ? Place a towel between your skin and the bag. ? Leave the ice on for 20 minutes, 2-3 times a day.  If directed, apply heat to the affected area as often as told by your health care provider. Use the heat source that your health care provider recommends, such as a moist heat pack or a heating pad. ? Place a towel between your skin and the heat source. ? Leave the heat on for 20-30 minutes. ? Remove the heat if your skin turns bright red. This is especially important if you are unable to feel pain, heat, or cold. You may have a greater risk of getting burned.  Take over-the-counter and prescription medicines only as told by your health care provider.  Return to your normal activities as told by your health care provider. Ask your health care provider what activities are safe  for you.  Keep all follow-up visits as told by your health care provider. This is important. Contact a health care provider if:  You have chills or a fever.  Your pain does not go away or it gets worse.  You have a cough that does not go away (is persistent). Get help right away if:  You have shortness of breath. This information is not intended to replace advice given to you by your health care provider. Make sure you discuss any questions you  have with your health care provider. Document Released: 07/03/2005 Document Revised: 06/25/2017 Document Reviewed: 01/17/2016 Elsevier Interactive Patient Education  [employer].

## 2018-12-01 NOTE — Assessment & Plan Note (Addendum)
Still consistent with MSK likely costochondritis.  rec continue NSAID, heating pad, handout provided today.

## 2018-12-01 NOTE — Assessment & Plan Note (Signed)
Baseline EKG today.  BP remaining elevated despite 3 weeks of amlodipine 5mg  daily. Will change to lotrel 5/10mg  daily. Reassess at CPE next month. Pt agrees with plan.

## 2018-12-07 ENCOUNTER — Other Ambulatory Visit: Payer: Self-pay | Admitting: Family Medicine

## 2018-12-07 DIAGNOSIS — E876 Hypokalemia: Secondary | ICD-10-CM

## 2018-12-07 DIAGNOSIS — E785 Hyperlipidemia, unspecified: Secondary | ICD-10-CM

## 2018-12-07 DIAGNOSIS — I10 Essential (primary) hypertension: Secondary | ICD-10-CM

## 2018-12-08 ENCOUNTER — Other Ambulatory Visit: Payer: Managed Care, Other (non HMO)

## 2018-12-11 ENCOUNTER — Encounter: Payer: Self-pay | Admitting: Family Medicine

## 2018-12-11 ENCOUNTER — Ambulatory Visit (INDEPENDENT_AMBULATORY_CARE_PROVIDER_SITE_OTHER): Payer: Managed Care, Other (non HMO) | Admitting: Family Medicine

## 2018-12-11 VITALS — BP 120/78 | HR 65 | Temp 98.0°F | Ht 69.5 in | Wt 231.3 lb

## 2018-12-11 DIAGNOSIS — Z23 Encounter for immunization: Secondary | ICD-10-CM | POA: Diagnosis not present

## 2018-12-11 DIAGNOSIS — Z0000 Encounter for general adult medical examination without abnormal findings: Secondary | ICD-10-CM | POA: Insufficient documentation

## 2018-12-11 DIAGNOSIS — E785 Hyperlipidemia, unspecified: Secondary | ICD-10-CM

## 2018-12-11 DIAGNOSIS — E669 Obesity, unspecified: Secondary | ICD-10-CM

## 2018-12-11 DIAGNOSIS — I10 Essential (primary) hypertension: Secondary | ICD-10-CM

## 2018-12-11 DIAGNOSIS — F411 Generalized anxiety disorder: Secondary | ICD-10-CM

## 2018-12-11 DIAGNOSIS — M940 Chondrocostal junction syndrome [Tietze]: Secondary | ICD-10-CM

## 2018-12-11 DIAGNOSIS — Z0001 Encounter for general adult medical examination with abnormal findings: Secondary | ICD-10-CM | POA: Insufficient documentation

## 2018-12-11 DIAGNOSIS — Z3049 Encounter for surveillance of other contraceptives: Secondary | ICD-10-CM | POA: Diagnosis not present

## 2018-12-11 MED ORDER — SERTRALINE HCL 50 MG PO TABS: 50.0000 mg | ORAL_TABLET | Freq: Every day | ORAL | 6 refills | Status: DC

## 2018-12-11 NOTE — Assessment & Plan Note (Signed)
Preventative protocols reviewed and updated unless pt declined. Discussed healthy diet and lifestyle.  

## 2018-12-11 NOTE — Assessment & Plan Note (Signed)
This continues improving.  

## 2018-12-11 NOTE — Assessment & Plan Note (Signed)
Continued weight loss noted. Pt motivated to continue healthy changes.

## 2018-12-11 NOTE — Patient Instructions (Addendum)
Flu shot today Increase potassium rich foods.  Decrease added sugars, eliminate trans fats, increase fiber and limit alcohol. Increase fatty fish (salmon, tuna, trout, etc) in the diet - these fish are rich in omega three fatty acids. All these changes together can drop triglycerides by almost 50%. Start sertraline 62m 1/2 tablet for the first week then increase to full tablet.  You are doing well today. Return as needed or in 4-6 months for follow up visit with labs  Health Maintenance, Female Adopting a healthy lifestyle and getting preventive care can go a long way to promote health and wellness. Talk with your health care provider about what schedule of regular examinations is right for you. This is a good chance for you to check in with your provider about disease prevention and staying healthy. In between checkups, there are plenty of things you can do on your own. Experts have done a lot of research about which lifestyle changes and preventive measures are most likely to keep you healthy. Ask your health care provider for more information. Weight and diet Eat a healthy diet  Be sure to include plenty of vegetables, fruits, low-fat dairy products, and lean protein.  Do not eat a lot of foods high in solid fats, added sugars, or salt.  Get regular exercise. This is one of the most important things you can do for your health. ? Most adults should exercise for at least 150 minutes each week. The exercise should increase your heart rate and make you sweat (moderate-intensity exercise). ? Most adults should also do strengthening exercises at least twice a week. This is in addition to the moderate-intensity exercise. Maintain a healthy weight  Body mass index (BMI) is a measurement that can be used to identify possible weight problems. It estimates body fat based on height and weight. Your health care provider can help determine your BMI and help you achieve or maintain a healthy weight.  For  females 236years of age and older: ? A BMI below 18.5 is considered underweight. ? A BMI of 18.5 to 24.9 is normal. ? A BMI of 25 to 29.9 is considered overweight. ? A BMI of 30 and above is considered obese. Watch levels of cholesterol and blood lipids  You should start having your blood tested for lipids and cholesterol at 30years of age, then have this test every 5 years.  You may need to have your cholesterol levels checked more often if: ? Your lipid or cholesterol levels are high. ? You are older than 30years of age. ? You are at high risk for heart disease. Cancer screening Lung Cancer  Lung cancer screening is recommended for adults 552819years old who are at high risk for lung cancer because of a history of smoking.  A yearly low-dose CT scan of the lungs is recommended for people who: ? Currently smoke. ? Have quit within the past 15 years. ? Have at least a 30-pack-year history of smoking. A pack year is smoking an average of one pack of cigarettes a day for 1 year.  Yearly screening should continue until it has been 15 years since you quit.  Yearly screening should stop if you develop a health problem that would prevent you from having lung cancer treatment. Breast Cancer  Practice breast self-awareness. This means understanding how your breasts normally appear and feel.  It also means doing regular breast self-exams. Let your health care provider know about any changes, no matter how small.  If you are in your 20s or 30s, you should have a clinical breast exam (CBE) by a health care provider every 1-3 years as part of a regular health exam.  If you are 87 or older, have a CBE every year. Also consider having a breast X-ray (mammogram) every year.  If you have a family history of breast cancer, talk to your health care provider about genetic screening.  If you are at high risk for breast cancer, talk to your health care provider about having an MRI and a mammogram  every year.  Breast cancer gene (BRCA) assessment is recommended for women who have family members with BRCA-related cancers. BRCA-related cancers include: ? Breast. ? Ovarian. ? Tubal. ? Peritoneal cancers.  Results of the assessment will determine the need for genetic counseling and BRCA1 and BRCA2 testing. Cervical Cancer Your health care provider may recommend that you be screened regularly for cancer of the pelvic organs (ovaries, uterus, and vagina). This screening involves a pelvic examination, including checking for microscopic changes to the surface of your cervix (Pap test). You may be encouraged to have this screening done every 3 years, beginning at age 79.  For women ages 31-65, health care providers may recommend pelvic exams and Pap testing every 3 years, or they may recommend the Pap and pelvic exam, combined with testing for human papilloma virus (HPV), every 5 years. Some types of HPV increase your risk of cervical cancer. Testing for HPV may also be done on women of any age with unclear Pap test results.  Other health care providers may not recommend any screening for nonpregnant women who are considered low risk for pelvic cancer and who do not have symptoms. Ask your health care provider if a screening pelvic exam is right for you.  If you have had past treatment for cervical cancer or a condition that could lead to cancer, you need Pap tests and screening for cancer for at least 20 years after your treatment. If Pap tests have been discontinued, your risk factors (such as having a new sexual partner) need to be reassessed to determine if screening should resume. Some women have medical problems that increase the chance of getting cervical cancer. In these cases, your health care provider may recommend more frequent screening and Pap tests. Colorectal Cancer  This type of cancer can be detected and often prevented.  Routine colorectal cancer screening usually begins at 30  years of age and continues through 30 years of age.  Your health care provider may recommend screening at an earlier age if you have risk factors for colon cancer.  Your health care provider may also recommend using home test kits to check for hidden blood in the stool.  A small camera at the end of a tube can be used to examine your colon directly (sigmoidoscopy or colonoscopy). This is done to check for the earliest forms of colorectal cancer.  Routine screening usually begins at age 89.  Direct examination of the colon should be repeated every 5-10 years through 30 years of age. However, you may need to be screened more often if early forms of precancerous polyps or small growths are found. Skin Cancer  Check your skin from head to toe regularly.  Tell your health care provider about any new moles or changes in moles, especially if there is a change in a mole's shape or color.  Also tell your health care provider if you have a mole that is larger than the  size of a pencil eraser.  Always use sunscreen. Apply sunscreen liberally and repeatedly throughout the day.  Protect yourself by wearing long sleeves, pants, a wide-brimmed hat, and sunglasses whenever you are outside. Heart disease, diabetes, and high blood pressure  High blood pressure causes heart disease and increases the risk of stroke. High blood pressure is more likely to develop in: ? People who have blood pressure in the high end of the normal range (130-139/85-89 mm Hg). ? People who are overweight or obese. ? People who are African American.  If you are 24-77 years of age, have your blood pressure checked every 3-5 years. If you are 48 years of age or older, have your blood pressure checked every year. You should have your blood pressure measured twice-once when you are at a hospital or clinic, and once when you are not at a hospital or clinic. Record the average of the two measurements. To check your blood pressure when  you are not at a hospital or clinic, you can use: ? An automated blood pressure machine at a pharmacy. ? A home blood pressure monitor.  If you are between 19 years and 19 years old, ask your health care provider if you should take aspirin to prevent strokes.  Have regular diabetes screenings. This involves taking a blood sample to check your fasting blood sugar level. ? If you are at a normal weight and have a low risk for diabetes, have this test once every three years after 30 years of age. ? If you are overweight and have a high risk for diabetes, consider being tested at a younger age or more often. Preventing infection Hepatitis B  If you have a higher risk for hepatitis B, you should be screened for this virus. You are considered at high risk for hepatitis B if: ? You were born in a country where hepatitis B is common. Ask your health care provider which countries are considered high risk. ? Your parents were born in a high-risk country, and you have not been immunized against hepatitis B (hepatitis B vaccine). ? You have HIV or AIDS. ? You use needles to inject street drugs. ? You live with someone who has hepatitis B. ? You have had sex with someone who has hepatitis B. ? You get hemodialysis treatment. ? You take certain medicines for conditions, including cancer, organ transplantation, and autoimmune conditions. Hepatitis C  Blood testing is recommended for: ? Everyone born from [birth year] through 1967. ? Anyone with known risk factors for hepatitis C. Sexually transmitted infections (STIs)  You should be screened for sexually transmitted infections (STIs) including gonorrhea and chlamydia if: ? You are sexually active and are younger than 30 years of age. ? You are older than 30 years of age and your health care provider tells you that you are at risk for this type of infection. ? Your sexual activity has changed since you were last screened and you are at an increased risk for  chlamydia or gonorrhea. Ask your health care provider if you are at risk.  If you do not have HIV, but are at risk, it may be recommended that you take a prescription medicine daily to prevent HIV infection. This is called pre-exposure prophylaxis (PrEP). You are considered at risk if: ? You are sexually active and do not regularly use condoms or know the HIV status of your partner(s). ? You take drugs by injection. ? You are sexually active with a partner who has HIV.  Talk with your health care provider about whether you are at high risk of being infected with HIV. If you choose to begin PrEP, you should first be tested for HIV. You should then be tested every 3 months for as long as you are taking PrEP. Pregnancy  If you are premenopausal and you may become pregnant, ask your health care provider about preconception counseling.  If you may become pregnant, take 400 to 800 micrograms (mcg) of folic acid every day.  If you want to prevent pregnancy, talk to your health care provider about birth control (contraception). Osteoporosis and menopause  Osteoporosis is a disease in which the bones lose minerals and strength with aging. This can result in serious bone fractures. Your risk for osteoporosis can be identified using a bone density scan.  If you are 83 years of age or older, or if you are at risk for osteoporosis and fractures, ask your health care provider if you should be screened.  Ask your health care provider whether you should take a calcium or vitamin D supplement to lower your risk for osteoporosis.  Menopause may have certain physical symptoms and risks.  Hormone replacement therapy may reduce some of these symptoms and risks. Talk to your health care provider about whether hormone replacement therapy is right for you. Follow these instructions at home:  Schedule regular health, dental, and eye exams.  Stay current with your immunizations.  Do not use any tobacco products  including cigarettes, chewing tobacco, or electronic cigarettes.  If you are pregnant, do not drink alcohol.  If you are breastfeeding, limit how much and how often you drink alcohol.  Limit alcohol intake to no more than 1 drink per day for nonpregnant women. One drink equals 12 ounces of beer, 5 ounces of wine, or 1 ounces of hard liquor.  Do not use street drugs.  Do not share needles.  Ask your health care provider for help if you need support or information about quitting drugs.  Tell your health care provider if you often feel depressed.  Tell your health care provider if you have ever been abused or do not feel safe at home. This information is not intended to replace advice given to you by your health care provider. Make sure you discuss any questions you have with your health care provider. Document Released: 04/08/2011 Document Revised: 02/29/2016 Document Reviewed: 06/27/2015 Elsevier Interactive Patient Education  2019 [employer].

## 2018-12-11 NOTE — Assessment & Plan Note (Signed)
Reviewed recent FLP. Reviewed healthy diet choices to improve triglyceride levels. The ASCVD Risk score Denman George DC Jr., et al., 2013) failed to calculate for the following reasons:   The 2013 ASCVD risk score is only valid for ages 76 to 58

## 2018-12-11 NOTE — Assessment & Plan Note (Signed)
paraguard placed 10/2015

## 2018-12-11 NOTE — Assessment & Plan Note (Addendum)
Well controlled on lotrel. Continue. Reassess at 4-6 mo f/u  Recent hypokalemia noted - she has since started benazepril. Reviewed high potassium diet.

## 2018-12-11 NOTE — Progress Notes (Signed)
BP 120/78 (BP Location: Left Arm, Patient Position: Sitting, Cuff Size: Large)   Pulse 65   Temp 98 F (36.7 C) (Oral)   Ht 5' 9.5" (1.765 m)   Wt 231 lb 5 oz (104.9 kg)   LMP 12/07/2018   SpO2 100%   BMI 33.67 kg/m    CC: CPE Subjective:    Patient ID: Jennifer Mays, female    DOB: April 17, 1991, 28 y.o.   MRN: [id number]  HPI: Jennifer Mays is a 28 y.o. female presenting on 12/11/2018 for Annual Exam (Feeling anxious for last 2 mos. Previously on Zoloft. )   HTN - compliant with amlodipine/benazepril 5/10mg  daily. Tolerating med well.   Birth control - paraguard placed 10/2015.   Anxiety - ongoing trouble. Previously on zoloft for postpartum anxiety. She continues L-theonine. 10/2018 increased anxiety since then. Significant life changes this year - youngest starting school, she is planning to return to work. Trouble sleeping at night. Checks doors 4 times to ensure locked, checks children at night. Hypervigilant.   Preventative: Flu - yearly Tetanus - 2016 Seat belt use discussed.  Sunscreen use discussed - no changing moles on skin Alcohol - cutting down on wine to help triglycerides Smoking - none  Dentist q6 mo Eye exam yearly  Caffeine: 1-2 cups coffee/am Lives with husband, husband's friends, 1 dog Studying psychology in [place] Activity: walking, has treadmill Diet: good fruits/vegetables, avoids fast foods/processed foods, good amt water     Relevant past medical, surgical, family and social history reviewed and updated as indicated. Interim medical history since our last visit reviewed. Allergies and medications reviewed and updated. Outpatient Medications Prior to Visit  Medication Sig Dispense Refill  . amLODipine-benazepril (LOTREL) 5-10 MG capsule Take 1 capsule by mouth daily. 30 capsule 6  . omeprazole (PRILOSEC OTC) 20 MG tablet Take 20 mg by mouth daily.    Marland Kitchen PARAGARD INTRAUTERINE COPPER IU by Intrauterine route.    Marland Kitchen amLODipine (NORVASC) 5  MG tablet Take 1 tablet (5 mg total) by mouth daily. (Patient not taking: Reported on 12/11/2018) 30 tablet 3   No facility-administered medications prior to visit.      Per HPI unless specifically indicated in ROS section below Review of Systems  Constitutional: Negative for activity change, appetite change, chills, fatigue, fever and unexpected weight change.  HENT: Negative for hearing loss.   Eyes: Negative for visual disturbance.  Respiratory: Negative for cough, chest tightness, shortness of breath and wheezing.   Cardiovascular: Positive for chest pain (improving costochondritis). Negative for palpitations and leg swelling.  Gastrointestinal: Negative for abdominal distention, abdominal pain, blood in stool, constipation, diarrhea, nausea and vomiting.  Genitourinary: Negative for difficulty urinating and hematuria.  Musculoskeletal: Negative for arthralgias, myalgias and neck pain.  Skin: Negative for rash.  Neurological: Positive for headaches (occasional ?anxiety related). Negative for dizziness, seizures and syncope.  Hematological: Negative for adenopathy. Does not bruise/bleed easily.  Psychiatric/Behavioral: Negative for dysphoric mood. The patient is nervous/anxious.    Objective:    BP 120/78 (BP Location: Left Arm, Patient Position: Sitting, Cuff Size: Large)   Pulse 65   Temp 98 F (36.7 C) (Oral)   Ht 5' 9.5" (1.765 m)   Wt 231 lb 5 oz (104.9 kg)   LMP 12/07/2018   SpO2 100%   BMI 33.67 kg/m   Wt Readings from Last 3 Encounters:  12/11/18 231 lb 5 oz (104.9 kg)  12/01/18 232 lb 6 oz (105.4 kg)  11/30/18 237 lb (  107.5 kg)    Physical Exam Vitals signs and nursing note reviewed.  Constitutional:      General: She is not in acute distress.    Appearance: She is well-developed.  HENT:     Head: Normocephalic and atraumatic.     Right Ear: Hearing, tympanic membrane, ear canal and external ear normal.     Left Ear: Hearing, tympanic membrane, ear canal and  external ear normal.     Nose: Nose normal.     Mouth/Throat:     Pharynx: Uvula midline. No oropharyngeal exudate or posterior oropharyngeal erythema.  Eyes:     General: No scleral icterus.    Conjunctiva/sclera: Conjunctivae normal.     Pupils: Pupils are equal, round, and reactive to light.  Neck:     Musculoskeletal: Normal range of motion and neck supple.  Cardiovascular:     Rate and Rhythm: Normal rate and regular rhythm.     Pulses:          Radial pulses are 2+ on the right side and 2+ on the left side.     Heart sounds: Normal heart sounds. No murmur.  Pulmonary:     Effort: Pulmonary effort is normal. No respiratory distress.     Breath sounds: Normal breath sounds. No wheezing or rales.  Abdominal:     General: Bowel sounds are normal. There is no distension.     Palpations: Abdomen is soft. There is no mass.     Tenderness: There is no abdominal tenderness. There is no guarding or rebound.  Musculoskeletal: Normal range of motion.  Lymphadenopathy:     Cervical: No cervical adenopathy.  Skin:    General: Skin is warm and dry.     Findings: No rash.  Neurological:     Mental Status: She is alert and oriented to person, place, and time.     Comments: CN grossly intact, station and gait intact  Psychiatric:        Behavior: Behavior normal.        Thought Content: Thought content normal.        Judgment: Judgment normal.       Results for orders placed or performed in visit on 12/01/18  Lipid panel  Result Value Ref Range   Cholesterol 228 (H) 0 - 200 mg/dL   Triglycerides 276.1 (H) 0.0 - 149.0 mg/dL   HDL 47.09 >29.57 mg/dL   VLDL 47.3 (H) 0.0 - 40.3 mg/dL   Total CHOL/HDL Ratio 3    NonHDL 143.73   Comprehensive metabolic panel  Result Value Ref Range   Sodium 135 135 - 145 mEq/L   Potassium 3.3 (L) 3.5 - 5.1 mEq/L   Chloride 101 96 - 112 mEq/L   CO2 23 19 - 32 mEq/L   Glucose, Bld 107 (H) 70 - 99 mg/dL   BUN 12 6 - 23 mg/dL   Creatinine, Ser 7.09  0.40 - 1.20 mg/dL   Total Bilirubin 1.7 (H) 0.2 - 1.2 mg/dL   Alkaline Phosphatase 44 39 - 117 U/L   AST 27 0 - 37 U/L   ALT 25 0 - 35 U/L   Total Protein 7.6 6.0 - 8.3 g/dL   Albumin 4.7 3.5 - 5.2 g/dL   Calcium 9.4 8.4 - 64.3 mg/dL   GFR 83.81 >84.03 mL/min  TSH  Result Value Ref Range   TSH 2.34 0.35 - 4.50 uIU/mL  Microalbumin / creatinine urine ratio  Result Value Ref Range   Microalb, Ur  4.7 (H) 0.0 - 1.9 mg/dL   Creatinine,U 299.3 mg/dL   Microalb Creat Ratio 1.2 0.0 - 30.0 mg/g  LDL cholesterol, direct  Result Value Ref Range   Direct LDL 128.0 mg/dL   Depression screen [facility] 2/9 12/11/2018  Decreased Interest 0  Down, Depressed, Hopeless 1  PHQ - 2 Score 1  Altered sleeping 3  Tired, decreased energy 1  Change in appetite 1  Feeling bad or failure about yourself  1  Trouble concentrating 0  Moving slowly or fidgety/restless 0  Suicidal thoughts 0  PHQ-9 Score 7    GAD 7 : Generalized Anxiety Score 12/11/2018  Nervous, Anxious, on Edge 3  Control/stop worrying 3  Worry too much - different things 3  Trouble relaxing 3  Restless 3  Easily annoyed or irritable 2  Afraid - awful might happen 3  Total GAD 7 Score 20   Assessment & Plan:   Problem List Items Addressed This Visit    Obesity, Class I, BMI 30.0-34.9 (see actual BMI)    Continued weight loss noted. Pt motivated to continue healthy changes.       Hyperlipidemia    Reviewed recent FLP. Reviewed healthy diet choices to improve triglyceride levels. The ASCVD Risk score Denman George DC Jr., et al., 2013) failed to calculate for the following reasons:   The 2013 ASCVD risk score is only valid for ages 44 to 85       HTN (hypertension)    Well controlled on lotrel. Continue. Reassess at 4-6 mo f/u  Recent hypokalemia noted - she has since started benazepril. Reviewed high potassium diet.       Health maintenance examination - Primary    Preventative protocols reviewed and updated unless pt  declined. Discussed healthy diet and lifestyle.       GAD (generalized anxiety disorder)    Returning - start sertraline 25mg  x 1 wk then increase to 50mg . Previously good effect.       Relevant Medications   sertraline (ZOLOFT) 50 MG tablet   Costochondritis    This continues improving.      Contraception management    paraguard placed 10/2015       Other Visit Diagnoses    Need for influenza vaccination       Relevant Orders   Flu Vaccine QUAD 36+ mos IM (Completed)       Meds ordered this encounter  Medications  . sertraline (ZOLOFT) 50 MG tablet    Sig: Take 1 tablet (50 mg total) by mouth daily. First week take 1/2 tablet daily    Dispense:  30 tablet    Refill:  6   Orders Placed This Encounter  Procedures  . Flu Vaccine QUAD 36+ mos IM    Follow up plan: Return in about 5 months (around 05/13/2019) for follow up visit.  Eustaquio Boyden, MD

## 2018-12-11 NOTE — Assessment & Plan Note (Addendum)
Returning - start sertraline 25mg  x 1 wk then increase to 50mg . Previously good effect.

## 2018-12-23 ENCOUNTER — Telehealth (INDEPENDENT_AMBULATORY_CARE_PROVIDER_SITE_OTHER): Payer: Self-pay

## 2018-12-23 NOTE — Telephone Encounter (Signed)
Patient called wanting to know if she was having anesthesia and she will not, it will be a series of needle sticks. The patient also wanted to know if the procedure will be in our office the patient is having a laser procedure which will be performed in our office. I was unable to contact the patient, her phone stated she was unavailable so I was also unable to leave a message.

## 2019-01-02 ENCOUNTER — Other Ambulatory Visit: Payer: Self-pay | Admitting: Family Medicine

## 2019-03-26 ENCOUNTER — Ambulatory Visit (INDEPENDENT_AMBULATORY_CARE_PROVIDER_SITE_OTHER): Payer: Managed Care, Other (non HMO) | Admitting: Vascular Surgery

## 2019-03-26 ENCOUNTER — Encounter (INDEPENDENT_AMBULATORY_CARE_PROVIDER_SITE_OTHER): Payer: Self-pay | Admitting: Vascular Surgery

## 2019-03-26 ENCOUNTER — Other Ambulatory Visit: Payer: Self-pay

## 2019-03-26 VITALS — BP 143/95 | HR 91 | Resp 16 | Ht 69.5 in | Wt 233.4 lb

## 2019-03-26 DIAGNOSIS — I83813 Varicose veins of bilateral lower extremities with pain: Secondary | ICD-10-CM

## 2019-03-26 NOTE — Progress Notes (Signed)
  Jennifer Mays is a 28 y.o. female who presents with symptomatic venous reflux  Past Medical History:  Diagnosis Date  . Abnormal Pap smear and cervical HPV (human papillomavirus) 2007   s/p LEEP for cervical dysplasia  . Anxiety   . Generalized headaches   . History of chicken pox   . History of chlamydia 02/2012   treated  . HTN (hypertension) 07/31/2011  . Migraines   . Varicose vein 03/2012   saw [facility], rec compression stocking    Past Surgical History:  Procedure Laterality Date  . INTRAUTERINE DEVICE INSERTION  10/22/2015  . LEEP  2007  . TONSILLECTOMY AND ADENOIDECTOMY  2002     Current Outpatient Medications:  .  amLODipine-benazepril (LOTREL) 5-10 MG capsule, Take 1 capsule by mouth daily., Disp: 30 capsule, Rfl: 6 .  omeprazole (PRILOSEC OTC) 20 MG tablet, Take 20 mg by mouth daily., Disp: , Rfl:  .  PARAGARD INTRAUTERINE COPPER IU, by Intrauterine route., Disp: , Rfl:  .  sertraline (ZOLOFT) 50 MG tablet, Take 1 tablet (50 mg total) by mouth daily., Disp: 90 tablet, Rfl: 1  Allergies  Allergen Reactions  . Sulfa Drugs Cross Reactors Hives  . Yaz [Drospirenone-Ethinyl Estradiol] Other (See Comments)    Headache     Varicose veins of bilateral lower extremities with pain   PLAN: The patient's right lower extremity was sterilely prepped and draped. The ultrasound machine was used to visualize the saphenous vein throughout its course. A segment just below the knee was selected for access. The saphenous vein was accessed without difficulty using ultrasound guidance with a micropuncture needle. A 0.018 wire was then placed beyond the saphenofemoral junction and the needle was removed. The 65 cm sheath was then placed over the wire and the wire and dilator were removed. The laser fiber was then placed through the sheath and its tip was placed approximately 4-5 centimeters below the saphenofemoral junction. Tumescent anesthesia was then created with a dilute lidocaine  solution. Laser energy was then delivered with constant withdrawal of the sheath and laser fiber. Approximately 1243 joules of energy were delivered over a length of 33 centimeters using a 1470 Hz VenaCure machine at 7 W. Sterile dressings were placed. The patient tolerated the procedure well without obvious complications.   Follow-up in 1 week with post-laser duplex.

## 2019-03-29 ENCOUNTER — Encounter (INDEPENDENT_AMBULATORY_CARE_PROVIDER_SITE_OTHER): Payer: Managed Care, Other (non HMO)

## 2019-03-30 ENCOUNTER — Other Ambulatory Visit (INDEPENDENT_AMBULATORY_CARE_PROVIDER_SITE_OTHER): Payer: Self-pay | Admitting: Vascular Surgery

## 2019-03-30 DIAGNOSIS — I83813 Varicose veins of bilateral lower extremities with pain: Secondary | ICD-10-CM

## 2019-04-01 ENCOUNTER — Ambulatory Visit (INDEPENDENT_AMBULATORY_CARE_PROVIDER_SITE_OTHER): Payer: Managed Care, Other (non HMO)

## 2019-04-01 ENCOUNTER — Other Ambulatory Visit: Payer: Self-pay

## 2019-04-01 DIAGNOSIS — I83813 Varicose veins of bilateral lower extremities with pain: Secondary | ICD-10-CM

## 2019-04-15 ENCOUNTER — Telehealth (INDEPENDENT_AMBULATORY_CARE_PROVIDER_SITE_OTHER): Payer: Self-pay | Admitting: Vascular Surgery

## 2019-04-15 NOTE — Telephone Encounter (Signed)
prescription has been called in

## 2019-04-15 NOTE — Telephone Encounter (Signed)
Please call in patients xanax .5mg  disp # 2 take 1st pill one hour prior to procedure. Take 2nd pill upon arrival. To [facility]  [address] in [place]. Her Laser is tomorrow

## 2019-04-16 ENCOUNTER — Ambulatory Visit (INDEPENDENT_AMBULATORY_CARE_PROVIDER_SITE_OTHER): Payer: Managed Care, Other (non HMO) | Admitting: Vascular Surgery

## 2019-04-16 ENCOUNTER — Other Ambulatory Visit: Payer: Self-pay

## 2019-04-16 ENCOUNTER — Encounter (INDEPENDENT_AMBULATORY_CARE_PROVIDER_SITE_OTHER): Payer: Self-pay | Admitting: Vascular Surgery

## 2019-04-16 VITALS — BP 150/95 | HR 78 | Resp 16 | Wt 232.0 lb

## 2019-04-16 DIAGNOSIS — I83813 Varicose veins of bilateral lower extremities with pain: Secondary | ICD-10-CM

## 2019-04-16 NOTE — Progress Notes (Signed)
  Jennifer Mays is a 28 y.o. female who presents with symptomatic venous reflux  Past Medical History:  Diagnosis Date  . Abnormal Pap smear and cervical HPV (human papillomavirus) 2007   s/p LEEP for cervical dysplasia  . Anxiety   . Generalized headaches   . History of chicken pox   . History of chlamydia 02/2012   treated  . HTN (hypertension) 07/31/2011  . Migraines   . Varicose vein 03/2012   saw [facility], rec compression stocking    Past Surgical History:  Procedure Laterality Date  . INTRAUTERINE DEVICE INSERTION  10/22/2015  . LEEP  2007  . TONSILLECTOMY AND ADENOIDECTOMY  2002     Current Outpatient Medications:  .  amLODipine-benazepril (LOTREL) 5-10 MG capsule, Take 1 capsule by mouth daily., Disp: 30 capsule, Rfl: 6 .  omeprazole (PRILOSEC OTC) 20 MG tablet, Take 20 mg by mouth daily., Disp: , Rfl:  .  PARAGARD INTRAUTERINE COPPER IU, by Intrauterine route., Disp: , Rfl:  .  sertraline (ZOLOFT) 50 MG tablet, Take 1 tablet (50 mg total) by mouth daily., Disp: 90 tablet, Rfl: 1  Allergies  Allergen Reactions  . Sulfa Drugs Cross Reactors Hives  . Yaz [Drospirenone-Ethinyl Estradiol] Other (See Comments)    Headache     Varicose veins of bilateral lower extremities with pain     PLAN: The patient's left lower extremity was sterilely prepped and draped. The ultrasound machine was used to visualize the saphenous vein throughout its course. A segment in the upper calf was selected for access. The saphenous vein was accessed without difficulty using ultrasound guidance with a micropuncture needle. A 0.018 wire was then placed beyond the saphenofemoral junction and the needle was removed. The 65 cm sheath was then placed over the wire and the wire and dilator were removed. The laser fiber was then placed through the sheath and its tip was placed approximately 4-5 centimeters below the saphenofemoral junction. Tumescent anesthesia was then created with a dilute lidocaine  solution. Laser energy was then delivered with constant withdrawal of the sheath and laser fiber. Approximately 1452 joules of energy were delivered over a length of 38 centimeters using a 1470 Hz VenaCure machine at 7 W. Sterile dressings were placed. The patient tolerated the procedure well without obvious complications.   Follow-up in 1 week with post-laser duplex.

## 2019-04-19 ENCOUNTER — Other Ambulatory Visit (INDEPENDENT_AMBULATORY_CARE_PROVIDER_SITE_OTHER): Payer: Self-pay | Admitting: Vascular Surgery

## 2019-04-19 ENCOUNTER — Encounter (INDEPENDENT_AMBULATORY_CARE_PROVIDER_SITE_OTHER): Payer: Managed Care, Other (non HMO)

## 2019-04-22 ENCOUNTER — Encounter: Payer: Self-pay | Admitting: Family Medicine

## 2019-04-22 ENCOUNTER — Ambulatory Visit (INDEPENDENT_AMBULATORY_CARE_PROVIDER_SITE_OTHER): Payer: Managed Care, Other (non HMO) | Admitting: Family Medicine

## 2019-04-22 ENCOUNTER — Other Ambulatory Visit: Payer: Self-pay

## 2019-04-22 VITALS — BP 124/82 | HR 81 | Temp 97.9°F | Ht 69.5 in | Wt 231.1 lb

## 2019-04-22 DIAGNOSIS — E785 Hyperlipidemia, unspecified: Secondary | ICD-10-CM

## 2019-04-22 DIAGNOSIS — F411 Generalized anxiety disorder: Secondary | ICD-10-CM

## 2019-04-22 DIAGNOSIS — Z0279 Encounter for issue of other medical certificate: Secondary | ICD-10-CM | POA: Diagnosis not present

## 2019-04-22 DIAGNOSIS — I10 Essential (primary) hypertension: Secondary | ICD-10-CM

## 2019-04-22 LAB — BASIC METABOLIC PANEL
BUN: 13 mg/dL (ref 6–23)
CO2: 24 mEq/L (ref 19–32)
Calcium: 9.1 mg/dL (ref 8.4–10.5)
Chloride: 100 mEq/L (ref 96–112)
Creatinine, Ser: 0.82 mg/dL (ref 0.40–1.20)
GFR: 81.99 mL/min (ref >60.00)
Glucose, Bld: 85 mg/dL (ref 70–99)
Potassium: 3.9 mEq/L (ref 3.5–5.1)
Sodium: 135 mEq/L (ref 135–145)

## 2019-04-22 LAB — LIPID PANEL
Cholesterol: 244 mg/dL — ABNORMAL HIGH (ref 0–200)
HDL: 70.7 mg/dL (ref >39.00)
NonHDL: 172.81
Total CHOL/HDL Ratio: 3
Triglycerides: 359 mg/dL — ABNORMAL HIGH (ref 0.0–149.0)
VLDL: 71.8 mg/dL — ABNORMAL HIGH (ref 0.0–40.0)

## 2019-04-22 LAB — LDL CHOLESTEROL, DIRECT: Direct LDL: 125 mg/dL

## 2019-04-22 MED ORDER — AMLODIPINE BESY-BENAZEPRIL HCL 5-10 MG PO CAPS: 1.0000 | ORAL_CAPSULE | Freq: Every day | ORAL | 3 refills | Status: DC

## 2019-04-22 MED ORDER — SERTRALINE HCL 50 MG PO TABS: 50.0000 mg | ORAL_TABLET | Freq: Every day | ORAL | 3 refills | Status: DC

## 2019-04-22 NOTE — Assessment & Plan Note (Signed)
Update FLP today.  

## 2019-04-22 NOTE — Patient Instructions (Addendum)
Labs today. Ok to cancel august appointment. Forms filled out today, may drop off if further forms needed.  Good to see you today, call us with questions.

## 2019-04-22 NOTE — Assessment & Plan Note (Addendum)
Doing very well since starting sertraline 50mg  daily - desires to continue this.

## 2019-04-22 NOTE — Assessment & Plan Note (Addendum)
Chronic, stable on lotrel - continue.  Update Cr/K+ today.  Again reviewed importance of avoiding pregnancy on ACEI

## 2019-04-22 NOTE — Assessment & Plan Note (Signed)
Forms for school filled out today.

## 2019-04-22 NOTE — Progress Notes (Signed)
This visit was conducted in person.  BP 124/82 (BP Location: Left Arm, Patient Position: Sitting, Cuff Size: Large)   Pulse 81   Temp 97.9 F (36.6 C) (Temporal)   Ht 5' 9.5" (1.765 m)   Wt 231 lb 2 oz (104.8 kg)   LMP 04/07/2019   SpO2 100%   BMI 33.64 kg/m    CC: fill out forms for school Subjective:    Patient ID: Jennifer Mays, female    DOB: [birth year], 28 y.o.   MRN: [id number]  HPI: Jennifer Mays is a 28 y.o. female presenting on 04/22/2019 for Form Completion (Provided work forms to be completed. )   CPE completed 12/2018.   HTN - Compliant with current antihypertensive regimen of amlodipine/benazepril.  Does check blood pressures at home: good control. No low blood pressure readings or symptoms of dizziness/syncope. Denies HA, vision changes, CP/tightness, SOB, leg swelling.   Anxiety - improved since starting sertraline 50mg  daily. Has been able to handle various covid stressors well.   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      Relevant past medical, surgical, family and social history reviewed and updated as indicated. Interim medical history since our last visit reviewed. Allergies and medications reviewed and updated. Outpatient Medications Prior to Visit  Medication Sig Dispense Refill  . omeprazole (PRILOSEC OTC) 20 MG tablet Take 20 mg by mouth daily.    Marland Kitchen. PARAGARD INTRAUTERINE COPPER IU by Intrauterine route.    Marland Kitchen. amLODipine-benazepril (LOTREL) 5-10 MG capsule Take 1 capsule by mouth daily. 30 capsule 6  . sertraline (ZOLOFT) 50 MG tablet Take 1 tablet (50 mg total) by mouth daily. 90 tablet 1   No facility-administered medications prior to visit.      Per HPI unless specifically indicated in ROS section below Review of Systems Objective:    BP 124/82 (BP Location: Left Arm, Patient Position: Sitting, Cuff Size:  Large)   Pulse 81   Temp 97.9 F (36.6 C) (Temporal)   Ht 5' 9.5" (1.765 m)   Wt 231 lb 2 oz (104.8 kg)   LMP 04/07/2019   SpO2 100%   BMI 33.64 kg/m   Wt Readings from Last 3 Encounters:  04/22/19 231 lb 2 oz (104.8 kg)  04/16/19 232 lb (105.2 kg)  03/26/19 233 lb 6.4 oz (105.9 kg)    Physical Exam Vitals signs and nursing note reviewed.  Constitutional:      General: She is not in acute distress.    Appearance: Normal appearance. She is not ill-appearing.  HENT:     Mouth/Throat:     Mouth: Mucous membranes are moist.     Pharynx: No posterior oropharyngeal erythema.  Cardiovascular:     Rate and Rhythm: Normal rate and regular rhythm.     Pulses: Normal pulses.     Heart sounds: Normal heart sounds. No murmur.  Pulmonary:     Effort: Pulmonary effort is normal. No respiratory distress.     Breath sounds: Normal breath sounds. No wheezing, rhonchi or rales.  Musculoskeletal:     Right lower leg: No edema.     Left lower leg: No edema.  Neurological:     Mental Status: She is alert.  Psychiatric:        Mood and Affect: Mood normal.        Behavior: Behavior normal.  Results for orders placed or performed in visit on 12/01/18  Lipid panel  Result Value Ref Range   Cholesterol 228 (H) 0 - 200 mg/dL   Triglycerides 284.0 (H) 0.0 - 149.0 mg/dL   HDL 84.00 >39.00 mg/dL   VLDL 56.8 (H) 0.0 - 40.0 mg/dL   Total CHOL/HDL Ratio 3    NonHDL 143.73   Comprehensive metabolic panel  Result Value Ref Range   Sodium 135 135 - 145 mEq/L   Potassium 3.3 (L) 3.5 - 5.1 mEq/L   Chloride 101 96 - 112 mEq/L   CO2 23 19 - 32 mEq/L   Glucose, Bld 107 (H) 70 - 99 mg/dL   BUN 12 6 - 23 mg/dL   Creatinine, Ser 0.82 0.40 - 1.20 mg/dL   Total Bilirubin 1.7 (H) 0.2 - 1.2 mg/dL   Alkaline Phosphatase 44 39 - 117 U/L   AST 27 0 - 37 U/L   ALT 25 0 - 35 U/L   Total Protein 7.6 6.0 - 8.3 g/dL   Albumin 4.7 3.5 - 5.2 g/dL   Calcium 9.4 8.4 - 10.5 mg/dL   GFR 82.21 >60.00 mL/min   TSH  Result Value Ref Range   TSH 2.34 0.35 - 4.50 uIU/mL  Microalbumin / creatinine urine ratio  Result Value Ref Range   Microalb, Ur 4.7 (H) 0.0 - 1.9 mg/dL   Creatinine,U 403.3 mg/dL   Microalb Creat Ratio 1.2 0.0 - 30.0 mg/g  LDL cholesterol, direct  Result Value Ref Range   Direct LDL 128.0 mg/dL   Assessment & Plan:   Problem List Items Addressed This Visit    Hyperlipidemia    Update FLP today.       Relevant Medications   amLODipine-benazepril (LOTREL) 5-10 MG capsule   Other Relevant Orders   Lipid panel   HTN (hypertension) - Primary    Chronic, stable on lotrel - continue.  Update Cr/K+ today.  Again reviewed importance of avoiding pregnancy on ACEI      Relevant Medications   amLODipine-benazepril (LOTREL) 5-10 MG capsule   Other Relevant Orders   Basic metabolic panel   GAD (generalized anxiety disorder)    Doing very well since starting sertraline 50mg  daily - desires to continue this.       Relevant Medications   sertraline (ZOLOFT) 50 MG tablet   Encounter for issuance of medical certificate    Forms for school filled out today.           Meds ordered this encounter  Medications  . amLODipine-benazepril (LOTREL) 5-10 MG capsule    Sig: Take 1 capsule by mouth daily.    Dispense:  90 capsule    Refill:  3  . sertraline (ZOLOFT) 50 MG tablet    Sig: Take 1 tablet (50 mg total) by mouth daily.    Dispense:  90 tablet    Refill:  3   Orders Placed This Encounter  Procedures  . Basic metabolic panel  . Lipid panel    Follow up plan: Return if symptoms worsen or fail to improve.  Ria Bush, MD

## 2019-04-23 ENCOUNTER — Ambulatory Visit (INDEPENDENT_AMBULATORY_CARE_PROVIDER_SITE_OTHER): Payer: Managed Care, Other (non HMO) | Admitting: Vascular Surgery

## 2019-04-23 ENCOUNTER — Ambulatory Visit (INDEPENDENT_AMBULATORY_CARE_PROVIDER_SITE_OTHER): Payer: Managed Care, Other (non HMO)

## 2019-04-23 DIAGNOSIS — I83813 Varicose veins of bilateral lower extremities with pain: Secondary | ICD-10-CM

## 2019-04-30 ENCOUNTER — Ambulatory Visit (INDEPENDENT_AMBULATORY_CARE_PROVIDER_SITE_OTHER): Payer: Managed Care, Other (non HMO) | Admitting: Vascular Surgery

## 2019-05-13 ENCOUNTER — Ambulatory Visit: Payer: Managed Care, Other (non HMO) | Admitting: Family Medicine

## 2019-05-14 ENCOUNTER — Ambulatory Visit (INDEPENDENT_AMBULATORY_CARE_PROVIDER_SITE_OTHER): Payer: Managed Care, Other (non HMO) | Admitting: Nurse Practitioner

## 2019-05-14 ENCOUNTER — Other Ambulatory Visit: Payer: Self-pay

## 2019-05-14 ENCOUNTER — Encounter (INDEPENDENT_AMBULATORY_CARE_PROVIDER_SITE_OTHER): Payer: Self-pay | Admitting: Nurse Practitioner

## 2019-05-14 VITALS — BP 167/129 | HR 72 | Resp 12 | Ht 70.0 in | Wt 237.0 lb

## 2019-05-14 DIAGNOSIS — E785 Hyperlipidemia, unspecified: Secondary | ICD-10-CM | POA: Diagnosis not present

## 2019-05-14 DIAGNOSIS — I83813 Varicose veins of bilateral lower extremities with pain: Secondary | ICD-10-CM | POA: Diagnosis not present

## 2019-05-14 DIAGNOSIS — I10 Essential (primary) hypertension: Secondary | ICD-10-CM

## 2019-05-17 ENCOUNTER — Encounter (INDEPENDENT_AMBULATORY_CARE_PROVIDER_SITE_OTHER): Payer: Self-pay | Admitting: Nurse Practitioner

## 2019-05-17 NOTE — Progress Notes (Signed)
SUBJECTIVE:  Patient ID: Jennifer Mays, female    DOB: 23-Jan-1991, 28 y.o.   MRN: [id number] Chief Complaint  Patient presents with  . Follow-up    HPI  Jennifer Mays is a 28 y.o. female The patient returns to the office for followup status post laser ablation of the right great saphenous vein on 03/26/2019 and the left on 04/16/2019. The patient notes multiple residual varicosities bilaterally which continued to hurt with dependent positions and remained tender to palpation. The patient's swelling is unchanged from preoperative status. The patient continues to wear graduated compression stockings on a daily basis but these are not eliminating the pain and discomfort. The patient continues to use over-the-counter anti-inflammatory medications to treat the pain and related symptoms but this has not given the patient relief. The patient notes the pain in the lower extremities is causing problems with daily exercise, problems at work and even with household activities such as preparing meals and doing dishes.The right leg is significantly worse than the left with prominent varicose from her thigh to the arch of her foot.  The patient is otherwise done well and there have been no complications related to the laser procedure or interval changes in the patient's overall   Venous ultrasound post laser shows successful laser ablation of the bilateral GSVs no DVT identified.  Past Medical History:  Diagnosis Date  . Abnormal Pap smear and cervical HPV (human papillomavirus) 2007   s/p LEEP for cervical dysplasia  . Anxiety   . Generalized headaches   . History of chicken pox   . History of chlamydia 02/2012   treated  . HTN (hypertension) 07/31/2011  . Migraines   . Varicose vein 03/2012   saw [facility], rec compression stocking    Past Surgical History:  Procedure Laterality Date  . INTRAUTERINE DEVICE INSERTION  10/22/2015  . LEEP  2007  . TONSILLECTOMY AND ADENOIDECTOMY  2002     Social History   Socioeconomic History  . Marital status: Married    Spouse name: Not on file  . Number of children: 2  . Years of education: Not on file  . Highest education level: Not on file  Occupational History  . Occupation: Futures traderHomemaker  Social Needs  . Financial resource strain: Not on file  . Food insecurity    Worry: Not on file    Inability: Not on file  . Transportation needs    Medical: Not on file    Non-medical: Not on file  Tobacco Use  . Smoking status: Former Smoker    Quit date: 10/07/2012    Years since quitting: 6.6  . Smokeless tobacco: Never Used  Substance and Sexual Activity  . Alcohol use: Yes    Alcohol/week: 2.0 standard drinks    Types: 2 Glasses of wine per week    Comment: drinks on weekends  . Drug use: No  . Sexual activity: Yes    Partners: Male    Birth control/protection: I.U.D.    Comment: Paraguard  Lifestyle  . Physical activity    Days per week: Not on file    Minutes per session: Not on file  . Stress: Not on file  Relationships  . Social Musicianconnections    Talks on phone: Not on file    Gets together: Not on file    Attends religious service: Not on file    Active member of club or organization: Not on file    Attends meetings of clubs or organizations:  Not on file    Relationship status: Not on file  . Intimate partner violence    Fear of current or ex partner: Not on file    Emotionally abused: Not on file    Physically abused: Not on file    Forced sexual activity: Not on file  Other Topics Concern  . Not on file  Social History Narrative   Caffeine: 1-2 cups coffee/am   Lives with husband, husband's friends, 1 dog   [employer]   Activity: yoga/pilates, no other reg exercise   Diet: good fruits/vegetables, avoids fast foods/processed foods, good amt water    Family History  Problem Relation Age of Onset  . Hypertension Father   . Asthma Father   . Coronary artery disease Paternal Grandmother  [id number]       MI  . Hypertension Paternal Grandmother   . Diabetes Paternal Grandfather   . Stroke Paternal Grandfather   . Hypertension Paternal Grandfather   . Alcohol abuse Maternal Grandfather   . Asthma Sister   . Parkinsonism Maternal Aunt   . Hypertension Paternal Aunt   . Hypertension Paternal Uncle   . Cancer Neg Hx     Allergies  Allergen Reactions  . Sulfa Drugs Cross Reactors Hives  . Yaz [Drospirenone-Ethinyl Estradiol] Other (See Comments)    Headache     Review of Systems   Review of Systems: Negative Unless Checked Constitutional: [] Weight loss  [] Fever  [] Chills Cardiac: [] Chest pain   []  Atrial Fibrillation  [] Palpitations   [] Shortness of breath when laying flat   [] Shortness of breath with exertion. [] Shortness of breath at rest Vascular:  [] Pain in legs with walking   [] Pain in legs with standing [] Pain in legs when laying flat   [] Claudication    [] Pain in feet when laying flat    [] History of DVT   [] Phlebitis   [x] Swelling in legs   [x] Varicose veins   [] Non-healing ulcers Pulmonary:   [] Uses home oxygen   [] Productive cough   [] Hemoptysis   [] Wheeze  [] COPD   [] Asthma Neurologic:  [] Dizziness   [] Seizures  [] Blackouts [] History of stroke   [] History of TIA  [] Aphasia   [] Temporary Blindness   [] Weakness or numbness in arm   [] Weakness or numbness in leg Musculoskeletal:   [] Joint swelling   [] Joint pain   [] Low back pain  []  History of Knee Replacement [] Arthritis [] back Surgeries  []  Spinal Stenosis    Hematologic:  [] Easy bruising  [] Easy bleeding   [] Hypercoagulable state   [] Anemic Gastrointestinal:  [] Diarrhea   [] Vomiting  [] Gastroesophageal reflux/heartburn   [] Difficulty swallowing. [] Abdominal pain Genitourinary:  [] Chronic kidney disease   [] Difficult urination  [] Anuric   [] Blood in urine [] Frequent urination  [] Burning with urination   [] Hematuria Skin:  [] Rashes   [] Ulcers [] Wounds Psychological:  [] History of anxiety   []  History of major  depression  []  Memory Difficulties      OBJECTIVE:   Physical Exam  BP (!) 167/129 (BP Location: Left Arm, Patient Position: Sitting, Cuff Size: Normal)   Pulse 72   Resp 12   Ht 5\' 10"  (1.778 m)   Wt 237 lb (107.5 kg)   BMI 34.01 kg/m   Gen: WD/WN, NAD Head: [place]/AT, No temporalis wasting.  Ear/Nose/Throat: Hearing grossly intact, nares w/o erythema or drainage Eyes: PER, EOMI, sclera nonicteric.  Neck: Supple, no masses.  No JVD.  Pulmonary:  Good air movement, no use of accessory muscles.  Cardiac: RRR  Vascular: Extensive varicose vein on right leg, 7-16mm that runs from her thigh to the arch of the foot.  The left has some 2-3 mm varicose veins.  Scattered spider veins bilaterally Vessel Right Left  Radial Palpable Palpable  Brachial Palpable Palpable  Femoral Palpable Palpable  Popliteal Palpable Palpable  Dorsalis Pedis Palpable Palpable  Posterior Tibial Palpable Palpable   Gastrointestinal: soft, non-distended. No guarding/no peritoneal signs.  Musculoskeletal: M/S 5/5 throughout.  No deformity or atrophy.  Neurologic: Pain and light touch intact in extremities.  Symmetrical.  Speech is fluent. Motor exam as listed above. Psychiatric: Judgment intact, Mood & affect appropriate for pt's clinical situation. Dermatologic: No Venous rashes. No Ulcers Noted.  No changes consistent with cellulitis. Lymph : No Cervical lymphadenopathy, no lichenification or skin changes of chronic lymphedema.       ASSESSMENT AND PLAN:  1. Varicose veins of bilateral lower extremities with pain Recommend:  The patient has had successful ablation of the previously incompetent saphenous venous system but still has persistent symptoms of pain and swelling that are having a negative impact on daily life and daily activities.  Patient should undergo injection of foam sclerotherapy of the right lower extremity with saline sclerotherapy on the left to treat the residual varicosities.  The  risks, benefits and alternative therapies were reviewed in detail with the patient.  All questions were answered.  The patient agrees to proceed with sclerotherapy at their convenience.  The patient will continue wearing the graduated compression stockings and using the over-the-counter pain medications to treat her symptoms.       2. Essential hypertension Continue antihypertensive medications as already ordered, these medications have been reviewed and there are no changes at this time.   3. Hyperlipidemia, unspecified hyperlipidemia type Continue statin as ordered and reviewed, no changes at this time    Current Outpatient Medications on File Prior to Visit  Medication Sig Dispense Refill  . amLODipine-benazepril (LOTREL) 5-10 MG capsule Take 1 capsule by mouth daily. 90 capsule 3  . omeprazole (PRILOSEC OTC) 20 MG tablet Take 20 mg by mouth daily.    Marland Kitchen PARAGARD INTRAUTERINE COPPER IU by Intrauterine route.    . sertraline (ZOLOFT) 50 MG tablet Take 1 tablet (50 mg total) by mouth daily. 90 tablet 3   No current facility-administered medications on file prior to visit.     There are no Patient Instructions on file for this visit. No follow-ups on file.   Kris Hartmann, NP  This note was completed with Sales executive.  Any errors are purely unintentional.

## 2019-06-01 ENCOUNTER — Telehealth: Payer: Self-pay | Admitting: Family Medicine

## 2019-06-01 ENCOUNTER — Ambulatory Visit (INDEPENDENT_AMBULATORY_CARE_PROVIDER_SITE_OTHER): Payer: Managed Care, Other (non HMO)

## 2019-06-01 DIAGNOSIS — Z111 Encounter for screening for respiratory tuberculosis: Secondary | ICD-10-CM

## 2019-06-01 NOTE — Telephone Encounter (Signed)
Form is in basket on Lisa's desk. ?

## 2019-06-01 NOTE — Progress Notes (Signed)
Patient received Tb test placement today for her job. Patient tolerated this well. Will return on 06/03/2019 to have this read. Patient will also drop off forms she needs to have filled out by the doctor for her work today or tomorrow.

## 2019-06-01 NOTE — Telephone Encounter (Signed)
Pt dropped off Health Examination Certificate to be completed. She had TB test done today and will be back Thurs for it to be read and pick up form. Form placed in [facility] for Dr. Danise Mina.

## 2019-06-03 ENCOUNTER — Ambulatory Visit: Payer: Managed Care, Other (non HMO)

## 2019-06-03 ENCOUNTER — Other Ambulatory Visit: Payer: Self-pay

## 2019-06-03 DIAGNOSIS — Z0289 Encounter for other administrative examinations: Secondary | ICD-10-CM

## 2019-06-03 LAB — TB SKIN TEST
Induration: 0 mm
TB Skin Test: NEGATIVE

## 2019-06-03 NOTE — Progress Notes (Signed)
Pt came in to have TB skin test read.  Also needed a vision/hearing screening for Health Exam Cert form.   All have been completed and documented.

## 2019-06-03 NOTE — Telephone Encounter (Addendum)
Pt had TB skin test read today.  Also, had vision/hearing screening to complete form.  Made copy to scan and returned original to pt.  Asked pt to bring in a copy of immunizations to scan into her chart.

## 2019-09-14 ENCOUNTER — Other Ambulatory Visit: Payer: Self-pay

## 2019-09-14 ENCOUNTER — Emergency Department (HOSPITAL_COMMUNITY): Payer: Managed Care, Other (non HMO)

## 2019-09-14 ENCOUNTER — Observation Stay (HOSPITAL_COMMUNITY)
Admission: EM | Admit: 2019-09-14 | Discharge: 2019-09-16 | Disposition: A | Discharge status: Home / Self Care | Payer: Managed Care, Other (non HMO) | Attending: Surgery | Admitting: Surgery

## 2019-09-14 ENCOUNTER — Encounter (HOSPITAL_COMMUNITY): Payer: Self-pay | Admitting: *Deleted

## 2019-09-14 ENCOUNTER — Observation Stay (HOSPITAL_COMMUNITY): Payer: Managed Care, Other (non HMO)

## 2019-09-14 DIAGNOSIS — Z882 Allergy status to sulfonamides status: Secondary | ICD-10-CM | POA: Insufficient documentation

## 2019-09-14 DIAGNOSIS — F411 Generalized anxiety disorder: Secondary | ICD-10-CM | POA: Insufficient documentation

## 2019-09-14 DIAGNOSIS — Z79899 Other long term (current) drug therapy: Secondary | ICD-10-CM | POA: Diagnosis not present

## 2019-09-14 DIAGNOSIS — Z888 Allergy status to other drugs, medicaments and biological substances status: Secondary | ICD-10-CM | POA: Insufficient documentation

## 2019-09-14 DIAGNOSIS — K810 Acute cholecystitis: Secondary | ICD-10-CM | POA: Diagnosis present

## 2019-09-14 DIAGNOSIS — R1011 Right upper quadrant pain: Secondary | ICD-10-CM

## 2019-09-14 DIAGNOSIS — Z419 Encounter for procedure for purposes other than remedying health state, unspecified: Secondary | ICD-10-CM

## 2019-09-14 DIAGNOSIS — Z87891 Personal history of nicotine dependence: Secondary | ICD-10-CM | POA: Insufficient documentation

## 2019-09-14 DIAGNOSIS — Z20828 Contact with and (suspected) exposure to other viral communicable diseases: Secondary | ICD-10-CM | POA: Insufficient documentation

## 2019-09-14 DIAGNOSIS — I10 Essential (primary) hypertension: Secondary | ICD-10-CM | POA: Diagnosis not present

## 2019-09-14 DIAGNOSIS — Z6830 Body mass index (BMI) 30.0-30.9, adult: Secondary | ICD-10-CM | POA: Diagnosis not present

## 2019-09-14 DIAGNOSIS — K812 Acute cholecystitis with chronic cholecystitis: Secondary | ICD-10-CM | POA: Diagnosis not present

## 2019-09-14 DIAGNOSIS — R112 Nausea with vomiting, unspecified: Secondary | ICD-10-CM

## 2019-09-14 DIAGNOSIS — E669 Obesity, unspecified: Secondary | ICD-10-CM | POA: Insufficient documentation

## 2019-09-14 DIAGNOSIS — Z975 Presence of (intrauterine) contraceptive device: Secondary | ICD-10-CM | POA: Diagnosis not present

## 2019-09-14 DIAGNOSIS — K819 Cholecystitis, unspecified: Secondary | ICD-10-CM

## 2019-09-14 HISTORY — DX: Acute cholecystitis: K81.0

## 2019-09-14 LAB — COMPREHENSIVE METABOLIC PANEL
ALT: 79 U/L — ABNORMAL HIGH (ref 0–44)
AST: 352 U/L — ABNORMAL HIGH (ref 15–41)
Albumin: 4.5 g/dL (ref 3.5–5.0)
Alkaline Phosphatase: 75 U/L (ref 38–126)
Anion gap: 14 (ref 5–15)
BUN: 14 mg/dL (ref 6–20)
CO2: 21 mmol/L — ABNORMAL LOW (ref 22–32)
Calcium: 9.3 mg/dL (ref 8.9–10.3)
Chloride: 102 mmol/L (ref 98–111)
Creatinine, Ser: 0.84 mg/dL (ref 0.44–1.00)
GFR calc Af Amer: >60 mL/min (ref >60)
GFR calc non Af Amer: >60 mL/min (ref >60)
Glucose, Bld: 122 mg/dL — ABNORMAL HIGH (ref 70–99)
Potassium: 3.9 mmol/L (ref 3.5–5.1)
Sodium: 137 mmol/L (ref 135–145)
Total Bilirubin: 1.1 mg/dL (ref 0.3–1.2)
Total Protein: 7.5 g/dL (ref 6.5–8.1)

## 2019-09-14 LAB — CBC
HCT: 44.8 % (ref 36.0–46.0)
Hemoglobin: 15.5 g/dL — ABNORMAL HIGH (ref 12.0–15.0)
MCH: 32.6 pg (ref 26.0–34.0)
MCHC: 34.6 g/dL (ref 30.0–36.0)
MCV: 94.3 fL (ref 80.0–100.0)
Platelets: 278 10*3/uL (ref 150–400)
RBC: 4.75 MIL/uL (ref 3.87–5.11)
RDW: 12.3 % (ref 11.5–15.5)
WBC: 11.4 10*3/uL — ABNORMAL HIGH (ref 4.0–10.5)
nRBC: 0 % (ref 0.0–0.2)

## 2019-09-14 LAB — HIV ANTIBODY (ROUTINE TESTING W REFLEX): HIV Screen 4th Generation wRfx: NONREACTIVE

## 2019-09-14 LAB — RESPIRATORY PANEL BY RT PCR (FLU A&B, COVID)
Influenza A by PCR: NEGATIVE
Influenza B by PCR: NEGATIVE
SARS Coronavirus 2 by RT PCR: NEGATIVE

## 2019-09-14 LAB — URINALYSIS, ROUTINE W REFLEX MICROSCOPIC
Bacteria, UA: NONE SEEN
Bilirubin Urine: NEGATIVE
Glucose, UA: NEGATIVE mg/dL
Ketones, ur: NEGATIVE mg/dL
Leukocytes,Ua: NEGATIVE
Nitrite: NEGATIVE
Protein, ur: 30 mg/dL — AB
Specific Gravity, Urine: 1.017 (ref 1.005–1.030)
pH: 7 (ref 5.0–8.0)

## 2019-09-14 LAB — LACTIC ACID, PLASMA
Lactic Acid, Venous: 3.2 mmol/L (ref 0.5–1.9)
Lactic Acid, Venous: 2.4 mmol/L (ref 0.5–1.9)

## 2019-09-14 LAB — I-STAT BETA HCG BLOOD, ED (MC, WL, AP ONLY): I-stat hCG, quantitative: <5 m[IU]/mL (ref <5)

## 2019-09-14 LAB — LIPASE, BLOOD: Lipase: 38 U/L (ref 11–51)

## 2019-09-14 LAB — POC SARS CORONAVIRUS 2 AG -  ED: SARS Coronavirus 2 Ag: NEGATIVE

## 2019-09-14 MED ORDER — ONDANSETRON HCL 4 MG/2ML IJ SOLN
4.0000 mg | Freq: Four times a day (QID) | INTRAMUSCULAR | Status: DC | PRN
  Administered 2019-09-14 – 2019-09-15 (×2): 4 mg via INTRAVENOUS
  Filled 2019-09-14 (×2): qty 2

## 2019-09-14 MED ORDER — OXYCODONE HCL 5 MG PO TABS
5.0000 mg | ORAL_TABLET | ORAL | Status: DC | PRN
  Administered 2019-09-14 – 2019-09-16 (×2): 10 mg via ORAL
  Filled 2019-09-14 (×2): qty 2

## 2019-09-14 MED ORDER — FENTANYL CITRATE (PF) 100 MCG/2ML IJ SOLN
50.0000 ug | INTRAMUSCULAR | Status: DC | PRN
  Administered 2019-09-14: 50 ug via NASAL
  Filled 2019-09-14: qty 2

## 2019-09-14 MED ORDER — AMLODIPINE BESYLATE 5 MG PO TABS: 5.0000 mg | ORAL_TABLET | Freq: Every day | ORAL | Status: DC

## 2019-09-14 MED ORDER — METHOCARBAMOL 500 MG PO TABS: 500.0000 mg | ORAL_TABLET | Freq: Four times a day (QID) | ORAL | Status: DC | PRN

## 2019-09-14 MED ORDER — MORPHINE SULFATE (PF) 2 MG/ML IV SOLN
1.0000 mg | INTRAVENOUS | Status: DC | PRN
  Administered 2019-09-14: 3 mg via INTRAVENOUS
  Administered 2019-09-14: 2 mg via INTRAVENOUS
  Administered 2019-09-15: 3 mg via INTRAVENOUS
  Administered 2019-09-15: 2 mg via INTRAVENOUS
  Administered 2019-09-15: 3 mg via INTRAVENOUS
  Filled 2019-09-14: qty 1
  Filled 2019-09-14 (×2): qty 2
  Filled 2019-09-14: qty 1
  Filled 2019-09-14: qty 2

## 2019-09-14 MED ORDER — SIMETHICONE 80 MG PO CHEW: 40.0000 mg | CHEWABLE_TABLET | Freq: Four times a day (QID) | ORAL | Status: DC | PRN

## 2019-09-14 MED ORDER — HYDROMORPHONE HCL 1 MG/ML IJ SOLN: 1.0000 mg | INTRAMUSCULAR | Status: DC | PRN

## 2019-09-14 MED ORDER — HYDROMORPHONE HCL 1 MG/ML IJ SOLN
1.0000 mg | Freq: Once | INTRAMUSCULAR | Status: AC
  Administered 2019-09-14: 1 mg via INTRAVENOUS
  Filled 2019-09-14: qty 1

## 2019-09-14 MED ORDER — METOPROLOL TARTRATE 5 MG/5ML IV SOLN: 5.0000 mg | Freq: Four times a day (QID) | INTRAVENOUS | Status: DC | PRN

## 2019-09-14 MED ORDER — POLYETHYLENE GLYCOL 3350 17 G PO PACK: 17.0000 g | PACK | Freq: Every day | ORAL | Status: DC | PRN

## 2019-09-14 MED ORDER — ENOXAPARIN SODIUM 40 MG/0.4ML ~~LOC~~ SOLN: 40.0000 mg | SUBCUTANEOUS | Status: DC

## 2019-09-14 MED ORDER — SODIUM CHLORIDE 0.9 % IV BOLUS
1000.0000 mL | Freq: Once | INTRAVENOUS | Status: AC
  Administered 2019-09-14: 1000 mL via INTRAVENOUS

## 2019-09-14 MED ORDER — KETOROLAC TROMETHAMINE 15 MG/ML IJ SOLN
15.0000 mg | Freq: Four times a day (QID) | INTRAMUSCULAR | Status: DC | PRN
  Administered 2019-09-15 (×2): 15 mg via INTRAVENOUS
  Filled 2019-09-14 (×2): qty 1

## 2019-09-14 MED ORDER — ONDANSETRON 4 MG PO TBDP: 4.0000 mg | ORAL_TABLET | Freq: Four times a day (QID) | ORAL | Status: DC | PRN

## 2019-09-14 MED ORDER — DIPHENHYDRAMINE HCL 50 MG/ML IJ SOLN: 25.0000 mg | Freq: Four times a day (QID) | INTRAMUSCULAR | Status: DC | PRN

## 2019-09-14 MED ORDER — ACETAMINOPHEN 650 MG RE SUPP: 650.0000 mg | Freq: Four times a day (QID) | RECTAL | Status: DC | PRN

## 2019-09-14 MED ORDER — SODIUM CHLORIDE 0.9% FLUSH
3.0000 mL | Freq: Once | INTRAVENOUS | Status: AC
  Administered 2019-09-14: 20:00:00 3 mL via INTRAVENOUS

## 2019-09-14 MED ORDER — SODIUM CHLORIDE 0.9 % IV SOLN
INTRAVENOUS | Status: DC
  Administered 2019-09-14 – 2019-09-15 (×2): via INTRAVENOUS

## 2019-09-14 MED ORDER — IOHEXOL 300 MG/ML  SOLN
100.0000 mL | Freq: Once | INTRAMUSCULAR | Status: AC | PRN
  Administered 2019-09-14: 100 mL via INTRAVENOUS

## 2019-09-14 MED ORDER — METRONIDAZOLE IN NACL 5-0.79 MG/ML-% IV SOLN
500.0000 mg | Freq: Three times a day (TID) | INTRAVENOUS | Status: DC
  Administered 2019-09-14 – 2019-09-16 (×6): 500 mg via INTRAVENOUS
  Filled 2019-09-14 (×6): qty 100

## 2019-09-14 MED ORDER — SODIUM CHLORIDE 0.9 % IV SOLN
2.0000 g | INTRAVENOUS | Status: DC
  Administered 2019-09-14 – 2019-09-15 (×2): 2 g via INTRAVENOUS
  Filled 2019-09-14: qty 20
  Filled 2019-09-14: qty 2
  Filled 2019-09-14: qty 20

## 2019-09-14 MED ORDER — ALPRAZOLAM 0.5 MG PO TABS
0.5000 mg | ORAL_TABLET | Freq: Two times a day (BID) | ORAL | Status: DC
  Administered 2019-09-14 – 2019-09-16 (×2): 0.5 mg via ORAL
  Filled 2019-09-14 (×2): qty 1
  Filled 2019-09-14: qty 2

## 2019-09-14 MED ORDER — HYDRALAZINE HCL 20 MG/ML IJ SOLN: 5.0000 mg | INTRAMUSCULAR | Status: DC | PRN

## 2019-09-14 MED ORDER — HEPARIN SODIUM (PORCINE) 5000 UNIT/ML IJ SOLN
5000.0000 [IU] | Freq: Three times a day (TID) | INTRAMUSCULAR | Status: DC
  Administered 2019-09-14: 5000 [IU] via SUBCUTANEOUS
  Filled 2019-09-14: qty 1

## 2019-09-14 MED ORDER — OXYCODONE-ACETAMINOPHEN 5-325 MG PO TABS: 1.0000 | ORAL_TABLET | ORAL | Status: DC | PRN

## 2019-09-14 MED ORDER — AMLODIPINE BESYLATE 5 MG PO TABS
5.0000 mg | ORAL_TABLET | Freq: Every day | ORAL | Status: DC
  Administered 2019-09-15 – 2019-09-16 (×2): 5 mg via ORAL
  Filled 2019-09-14 (×2): qty 1

## 2019-09-14 MED ORDER — ACETAMINOPHEN 325 MG PO TABS: 650.0000 mg | ORAL_TABLET | Freq: Four times a day (QID) | ORAL | Status: DC | PRN

## 2019-09-14 MED ORDER — KETOROLAC TROMETHAMINE 15 MG/ML IJ SOLN
15.0000 mg | Freq: Once | INTRAMUSCULAR | Status: AC
  Administered 2019-09-14: 15 mg via INTRAVENOUS
  Filled 2019-09-14: qty 1

## 2019-09-14 MED ORDER — DIPHENHYDRAMINE HCL 25 MG PO CAPS: 25.0000 mg | ORAL_CAPSULE | Freq: Four times a day (QID) | ORAL | Status: DC | PRN

## 2019-09-14 MED ORDER — BENAZEPRIL HCL 5 MG PO TABS
10.0000 mg | ORAL_TABLET | Freq: Every day | ORAL | Status: DC
  Administered 2019-09-15 – 2019-09-16 (×2): 10 mg via ORAL
  Filled 2019-09-14 (×2): qty 2

## 2019-09-14 MED ORDER — ONDANSETRON 4 MG PO TBDP
4.0000 mg | ORAL_TABLET | Freq: Once | ORAL | Status: AC | PRN
  Administered 2019-09-14: 4 mg via ORAL
  Filled 2019-09-14: qty 1

## 2019-09-14 MED ORDER — AMLODIPINE BESY-BENAZEPRIL HCL 5-10 MG PO CAPS: 1.0000 | ORAL_CAPSULE | Freq: Every day | ORAL | Status: DC

## 2019-09-14 MED ORDER — ONDANSETRON HCL 4 MG/2ML IJ SOLN
4.0000 mg | Freq: Once | INTRAMUSCULAR | Status: DC
  Filled 2019-09-14: qty 2

## 2019-09-14 MED ORDER — PANTOPRAZOLE SODIUM 40 MG IV SOLR
40.0000 mg | Freq: Every day | INTRAVENOUS | Status: DC
  Administered 2019-09-14 – 2019-09-15 (×2): 40 mg via INTRAVENOUS
  Filled 2019-09-14 (×2): qty 40

## 2019-09-14 NOTE — ED Notes (Signed)
First attempt to call report unsuccessful. 

## 2019-09-14 NOTE — ED Triage Notes (Signed)
Pt reports onset this am of mid back pain. Now has left side and right side abd pain with n/v. Denies any urinary symptoms.

## 2019-09-14 NOTE — ED Notes (Signed)
Family at bedside. 

## 2019-09-14 NOTE — ED Notes (Signed)
Patient transported to CT 

## 2019-09-14 NOTE — ED Provider Notes (Signed)
[facility] Provider Note   CSN: [id number] Arrival date & time: 09/14/19  1023     History   Chief Complaint Chief Complaint  Patient presents with  . Abdominal Pain  . Emesis    HPI Jennifer Mays is a 28 y.o. female.     The history is provided by the patient and medical records. No language interpreter was used.  Abdominal Pain Pain location:  RUQ Pain quality: aching and cramping   Pain radiates to:  RUQ and RLQ Pain severity:  Severe Onset quality:  Gradual Duration:  1 day Timing:  Constant Progression:  Waxing and waning Chronicity:  New Relieved by:  Nothing Worsened by:  Palpation Ineffective treatments:  None tried Associated symptoms: chills, nausea and vomiting   Associated symptoms: no chest pain, no constipation, no cough, no diarrhea, no dysuria, no fatigue, no fever, no melena and no shortness of breath     Past Medical History:  Diagnosis Date  . Abnormal Pap smear and cervical HPV (human papillomavirus) 2007   s/p LEEP for cervical dysplasia  . Anxiety   . Generalized headaches   . History of chicken pox   . History of chlamydia 02/2012   treated  . HTN (hypertension) 07/31/2011  . Migraines   . Varicose vein 03/2012   saw [facility], rec compression stocking    Patient Active Problem List   Diagnosis Date Noted  . Encounter for issuance of medical certificate 45/[id number]/9983  . Health maintenance examination 12/11/2018  . Hyperlipidemia 12/01/2018  . Chronic venous insufficiency 11/30/2018  . Obesity, Class I, BMI 30.0-34.9 (see actual BMI) 11/09/2018  . Costochondritis 04/23/2012  . Varicose veins of bilateral lower extremities with pain 03/26/2012  . GAD (generalized anxiety disorder) 08/20/2011  . Contraception management 08/20/2011  . HTN (hypertension) 07/31/2011  . Stress 07/31/2011    Past Surgical History:  Procedure Laterality Date  . INTRAUTERINE DEVICE INSERTION  10/22/2015  . LEEP  2007   . TONSILLECTOMY AND ADENOIDECTOMY  2002     OB History    Gravida  3   Para  2   Term  2   Preterm      AB  1   Living  2     SAB      TAB  1   Ectopic      Multiple      Live Births  2            Home Medications    Prior to Admission medications   Medication Sig Start Date End Date Taking? Authorizing Provider  amLODipine-benazepril (LOTREL) 5-10 MG capsule Take 1 capsule by mouth daily. 04/22/19   Ria Bush, MD  omeprazole (PRILOSEC OTC) 20 MG tablet Take 20 mg by mouth daily.    [provider]  PARAGARD INTRAUTERINE COPPER IU by Intrauterine route.    [provider]  sertraline (ZOLOFT) 50 MG tablet Take 1 tablet (50 mg total) by mouth daily. 04/22/19   Ria Bush, MD    Family History Family History  Problem Relation Age of Onset  . Hypertension Father   . Asthma Father   . Coronary artery disease Paternal Grandmother 74       MI  . Hypertension Paternal Grandmother   . Diabetes Paternal Grandfather   . Stroke Paternal Grandfather   . Hypertension Paternal Grandfather   . Alcohol abuse Maternal Grandfather   . Asthma Sister   . Parkinsonism Maternal Aunt   .  Hypertension Paternal Aunt   . Hypertension Paternal Uncle   . Cancer Neg Hx     Social History Social History   Tobacco Use  . Smoking status: Former Smoker    Quit date: 10/07/2012    Years since quitting: 6.9  . Smokeless tobacco: Never Used  Substance Use Topics  . Alcohol use: Yes    Alcohol/week: 2.0 standard drinks    Types: 2 Glasses of wine per week    Comment: drinks on weekends  . Drug use: No     Allergies   Sulfa drugs cross reactors and Yaz [drospirenone-ethinyl estradiol]   Review of Systems Review of Systems  Constitutional: Positive for chills. Negative for diaphoresis, fatigue and fever.  HENT: Negative for congestion.   Respiratory: Negative for cough, chest tightness and shortness of breath.   Cardiovascular: Negative  for chest pain and palpitations.  Gastrointestinal: Positive for abdominal pain, nausea and vomiting. Negative for constipation, diarrhea and melena.  Genitourinary: Positive for flank pain. Negative for dysuria.  Musculoskeletal: Positive for back pain. Negative for neck pain and neck stiffness.  Skin: Negative for rash and wound.  Neurological: Negative for headaches.  Psychiatric/Behavioral: Negative for agitation.  All other systems reviewed and are negative.    Physical Exam Updated Vital Signs BP (!) 138/100 (BP Location: Left Arm)   Pulse 80   Resp 18   SpO2 99%   Physical Exam Vitals signs and nursing note reviewed.  Constitutional:      General: She is not in acute distress.    Appearance: She is well-developed. She is ill-appearing. She is not toxic-appearing or diaphoretic.  HENT:     Head: Normocephalic and atraumatic.     Right Ear: External ear normal.     Left Ear: External ear normal.     Nose: Nose normal.     Mouth/Throat:     Mouth: Mucous membranes are moist.     Pharynx: No pharyngeal swelling or oropharyngeal exudate.  Eyes:     Conjunctiva/sclera: Conjunctivae normal.     Pupils: Pupils are equal, round, and reactive to light.  Neck:     Musculoskeletal: Normal range of motion and neck supple.  Cardiovascular:     Rate and Rhythm: Normal rate.     Heart sounds: Normal heart sounds. No murmur.  Pulmonary:     Effort: Pulmonary effort is normal. No respiratory distress.     Breath sounds: No stridor. No wheezing, rhonchi or rales.  Chest:     Chest wall: No tenderness.  Abdominal:     General: Abdomen is flat. Bowel sounds are normal. There is no distension.     Palpations: Abdomen is soft.     Tenderness: There is abdominal tenderness in the right upper quadrant and right lower quadrant. There is no right CVA tenderness, left CVA tenderness, guarding or rebound.    Skin:    General: Skin is warm.     Capillary Refill: Capillary refill takes  less than 2 seconds.     Findings: No erythema or rash.  Neurological:     General: No focal deficit present.     Mental Status: She is alert.     Motor: No abnormal muscle tone.     Coordination: Coordination normal.     Deep Tendon Reflexes: Reflexes are normal and symmetric.  Psychiatric:        Mood and Affect: Mood normal.      ED Treatments / Results  Labs (all labs  ordered are listed, but only abnormal results are displayed) Labs Reviewed  COMPREHENSIVE METABOLIC PANEL - Abnormal; Notable for the following components:      Result Value   CO2 21 (*)    Glucose, Bld 122 (*)    AST 352 (*)    ALT 79 (*)    All other components within normal limits  CBC - Abnormal; Notable for the following components:   WBC 11.4 (*)    Hemoglobin 15.5 (*)    All other components within normal limits  URINALYSIS, ROUTINE W REFLEX MICROSCOPIC - Abnormal; Notable for the following components:   APPearance HAZY (*)    Hgb urine dipstick SMALL (*)    Protein, ur 30 (*)    All other components within normal limits  LACTIC ACID, PLASMA - Abnormal; Notable for the following components:   Lactic Acid, Venous 3.2 (*)    All other components within normal limits  LACTIC ACID, PLASMA - Abnormal; Notable for the following components:   Lactic Acid, Venous 2.4 (*)    All other components within normal limits  RESPIRATORY PANEL BY RT PCR (FLU A&B, COVID)  LIPASE, BLOOD  HIV ANTIBODY (ROUTINE TESTING W REFLEX)  I-STAT BETA HCG BLOOD, ED ([facility], [facility], [facility] ONLY)  POC SARS CORONAVIRUS 2 AG -  ED    EKG None  Radiology Ct Abdomen Pelvis W Contrast  Result Date: 09/14/2019 CLINICAL DATA:  Right upper quadrant pain. Cholecystitis suspected. Cholecystitis versus appendicitis versus diverticulitis versus stone. EXAM: CT ABDOMEN AND PELVIS WITH CONTRAST TECHNIQUE: Multidetector CT imaging of the abdomen and pelvis was performed using the standard protocol following bolus administration of intravenous  contrast. CONTRAST:  OMNIPAQUE IOHEXOL 300 MG/ML  SOLN COMPARISON:  None. FINDINGS: Lower chest: Mild dependent opacity in the right base is identified. Lower chest is otherwise normal. Hepatobiliary: The gallbladder demonstrates wall thickening and adjacent fat stranding. No stones are definitely seen. The superior most aspect of the liver dome was not included on today's study. Visualized portions of the liver are normal. Portal vein is patent. Pancreas: Unremarkable. No pancreatic ductal dilatation or surrounding inflammatory changes. Spleen: Normal in size without focal abnormality. Adrenals/Urinary Tract: Adrenal glands are unremarkable. Kidneys are normal, without renal calculi, focal lesion, or hydronephrosis. Bladder is unremarkable. Stomach/Bowel: The stomach, small bowel, and colon are normal. Visualized portions of the appendix are normal with no evidence of appendicitis. Vascular/Lymphatic: No significant vascular findings are present. No enlarged abdominal or pelvic lymph nodes. Reproductive: An IUD is identified. The uterus and ovaries are unremarkable. Other: There is fluid adjacent to the liver, in the right pericolic gutter, and in the pelvis. Musculoskeletal: No acute or significant osseous findings. IMPRESSION: 1. The gallbladder is distended with a thickened wall and adjacent fat stranding. The findings are consistent with acute cholecystitis. No stones are seen on this study. Ultrasound could better evaluate if clinically warranted. 2. The fluid in the abdomen and pelvis is probably reactive from significant cholecystitis. Bile from a perforated cholecystitis is considered less likely. However, if there is concern, a nuclear medicine biliary leak study could be performed. Electronically Signed   By: Gerome Sam III M.D   On: 09/14/2019 14:59    Procedures Procedures (including critical care time)  CRITICAL CARE Performed by: Canary Brim Dejaun Vidrio Total critical care time: 35  minutes Critical care time was exclusive of separately billable procedures and treating other patients. Critical care was necessary to treat or prevent imminent or life-threatening deterioration. Critical care was  time spent personally by me on the following activities: development of treatment plan with patient and/or surrogate as well as nursing, discussions with consultants, evaluation of patient's response to treatment, examination of patient, obtaining history from patient or surrogate, ordering and performing treatments and interventions, ordering and review of laboratory studies, ordering and review of radiographic studies, pulse oximetry and re-evaluation of patient's condition.   Medications Ordered in ED Medications  sodium chloride flush (NS) 0.9 % injection 3 mL (has no administration in time range)  ondansetron (ZOFRAN) injection 4 mg (0 mg Intravenous Hold 09/14/19 1327)  cefTRIAXone (ROCEPHIN) 2 g in sodium chloride 0.9 % 100 mL IVPB (2 g Intravenous New Bag/Given 09/14/19 1550)  enoxaparin (LOVENOX) injection 40 mg (has no administration in time range)  0.9 %  sodium chloride infusion (has no administration in time range)  metoprolol tartrate (LOPRESSOR) injection 5 mg (has no administration in time range)  pantoprazole (PROTONIX) injection 40 mg (has no administration in time range)  simethicone (MYLICON) chewable tablet 40 mg (has no administration in time range)  ondansetron (ZOFRAN-ODT) disintegrating tablet 4 mg (has no administration in time range)    Or  ondansetron (ZOFRAN) injection 4 mg (has no administration in time range)  polyethylene glycol (MIRALAX / GLYCOLAX) packet 17 g (has no administration in time range)  diphenhydrAMINE (BENADRYL) capsule 25 mg (has no administration in time range)    Or  diphenhydrAMINE (BENADRYL) injection 25 mg (has no administration in time range)  methocarbamol (ROBAXIN) tablet 500 mg (has no administration in time range)  oxyCODONE  (Oxy IR/ROXICODONE) immediate release tablet 5-10 mg (has no administration in time range)  acetaminophen (TYLENOL) tablet 650 mg (has no administration in time range)    Or  acetaminophen (TYLENOL) suppository 650 mg (has no administration in time range)  metroNIDAZOLE (FLAGYL) IVPB 500 mg (has no administration in time range)  ALPRAZolam (XANAX) tablet 0.5 mg (has no administration in time range)  hydrALAZINE (APRESOLINE) injection 5-10 mg (has no administration in time range)  ketorolac (TORADOL) 15 MG/ML injection 15 mg (has no administration in time range)  morphine 2 MG/ML injection 1-3 mg (has no administration in time range)  benazepril (LOTENSIN) tablet 10 mg (has no administration in time range)  amLODipine (NORVASC) tablet 5 mg (has no administration in time range)  ondansetron (ZOFRAN-ODT) disintegrating tablet 4 mg (4 mg Oral Given 09/14/19 1036)  HYDROmorphone (DILAUDID) injection 1 mg (1 mg Intravenous Given 09/14/19 1319)  sodium chloride 0.9 % bolus 1,000 mL (0 mLs Intravenous Stopped 09/14/19 1528)  HYDROmorphone (DILAUDID) injection 1 mg (1 mg Intravenous Given 09/14/19 1421)  iohexol (OMNIPAQUE) 300 MG/ML solution 100 mL (100 mLs Intravenous Contrast Given 09/14/19 1437)  HYDROmorphone (DILAUDID) injection 1 mg (1 mg Intravenous Given 09/14/19 1524)  ketorolac (TORADOL) 15 MG/ML injection 15 mg (15 mg Intravenous Given 09/14/19 1535)     Initial Impression / Assessment and Plan / ED Course  I have reviewed the triage vital signs and the nursing notes.  Pertinent labs & imaging results that were available during my care of the patient were reviewed by me and considered in my medical decision making (see chart for details).        Jennifer Mays is a 28 y.o. female with a past medical history significant for hypertension, hyperlipidemia, and migraines who presents with right-sided abdominal pain.  Patient reports that her pain began early this morning and is right-sided  flank, back, and abdominal pain.  She reports nausea and  vomiting.  She denies any urinary symptoms with no hematuria or dysuria.  She reports no constipation or diarrhea and reports having a normal bowel movement several days ago.  She denies any blood in her emesis or stool.  She denies history of kidney stones.  She still has her gallbladder and appendix.  She reports the pain is greater than 10 out of 10 and is severe and constant.  No recent trauma.  She denies any history of similar symptoms.  She has no rash reported to indicate shingles.  She denies any vaginal discharge or vaginal bleeding and no pelvic symptoms.  On exam, patient does have tenderness in her right upper quadrant and right lower quadrant.  Also some right flank pain but no CVA tenderness on my exam.  Lungs are clear and chest is nontender.  She is not tachycardic or hypotensive.  She is very uncomfortable and crying with pain.  Exam otherwise unremarkable.  Clinically I am most concerned about a right-sided diverticulitis versus cholecystitis versus appendicitis versus kidney stone.  Will get CT scan to further evaluate as well as labs, pain medicine, nausea medicine, and fluids.  Anticipate reassessment after work-up.  CT scan shows acute cholecystitis.  LFTs elevated.  Patient will be given antibiotics and admitted to general surgery team for discussions of possible surgery for acute cholecystitis.  Patient given more pain medicine.  Will admit by surgery.   Final Clinical Impressions(s) / ED Diagnoses   Final diagnoses:  Acute cholecystitis  Right upper quadrant abdominal pain  Non-intractable vomiting with nausea, unspecified vomiting type    ED Discharge Orders    None     Clinical Impression: 1. Acute cholecystitis   2. Cholecystitis   3. Right upper quadrant abdominal pain   4. Non-intractable vomiting with nausea, unspecified vomiting type     Disposition: Admit  This note was prepared with assistance  of Dragon voice recognition software. Occasional wrong-word or sound-a-like substitutions may have occurred due to the inherent limitations of voice recognition software.     Ilea Hilton, Canary Brim, MD 09/14/19 [phone]

## 2019-09-14 NOTE — ED Notes (Signed)
Pt has been hyperventialting and crying out in her hallway bed.  After pain meds pt restful and appears to be sleeping.  Resp deep et unlabored.

## 2019-09-14 NOTE — ED Notes (Signed)
Pt restful at this time 

## 2019-09-14 NOTE — ED Notes (Signed)
Surgical PA  Here to eval patient.   She continues to have considerable pain responses with increased resp rate.

## 2019-09-14 NOTE — H&P (Addendum)
[facility] Admission Note  Jennifer Mays 12-11-1990  [id number].    Requesting MD: Lynden Oxford Chief Complaint abdominal pain  Reason for Consult: Possible cholecystitis  HPI:  Patient is a 28 year old female presents to the ED with onset of mid back pain that progressed to both the left and the right side with multiple episode nausea and vomiting. Pain started this AM. She described the pain as 10/10.  She is noted to be hyperventilating and crying in the hallways prior to pain medication. She still has significant pain in the RUQ, even after 3 doses of dilaudid.    Work-up in the ED showed CMP is normal except for a CO2 of 21, glucose of 122, and AST of 352, and ALT of 79.  Remainder the CMP was normal.  Creatinine 0.84, total bilirubin 1.1.  Lactate 3.2. WBC 11.4, hemoglobin 15.5, hematocrit 44.8, platelets 278,000. CT scan obtained 12/8: Shows the gallbladder is distended with thickened wall and adjacent fat stranding.  Findings are consistent with acute cholecystitis.  No stones were seen on the study.  Fluid in the abdomen pelvis was probably reactive from significant cholecystitis bowel from a perforated cholecystitis is less likely.  BP:158/114 - 141/101  HR 90's, RR 20's currently    ROS: Review of Systems  Constitutional: Negative.   HENT: Negative.   Eyes: Negative.   Respiratory: Negative.   Cardiovascular: Negative.   Gastrointestinal: Positive for abdominal pain, heartburn, nausea and vomiting. Negative for blood in stool, constipation, diarrhea and melena.  Genitourinary: Negative.   Musculoskeletal: Negative.   Skin: Negative.   Neurological: Positive for headaches (hx of migraines about 1 per month; last one about 2 weeks ago).  Endo/Heme/Allergies: Negative.   Psychiatric/Behavioral: The patient is nervous/anxious.     Family History  Problem Relation Age of Onset  . Hypertension Father   . Asthma Father   . Coronary artery disease  Paternal Grandmother 38       MI  . Hypertension Paternal Grandmother   . Diabetes Paternal Grandfather   . Stroke Paternal Grandfather   . Hypertension Paternal Grandfather   . Alcohol abuse Maternal Grandfather   . Asthma Sister   . Parkinsonism Maternal Aunt   . Hypertension Paternal Aunt   . Hypertension Paternal Uncle   . Cancer Neg Hx     Past Medical History:  Diagnosis Date  . Abnormal Pap smear and cervical HPV (human papillomavirus) 2007   s/p LEEP for cervical dysplasia  . Anxiety   . Generalized headaches   . History of chicken pox   . History of chlamydia 02/2012   treated  . HTN (hypertension) 07/31/2011  . Migraines   . Varicose vein 03/2012   saw [facility], rec compression stocking    Past Surgical History:  Procedure Laterality Date  . INTRAUTERINE DEVICE INSERTION  10/22/2015  . LEEP  2007  . TONSILLECTOMY AND ADENOIDECTOMY  2002    Social History:  reports that she quit smoking about 6 years ago. She has never used smokeless tobacco. She reports current alcohol use of about 2.0 standard drinks of alcohol per week. She reports that she does not use drugs.  Allergies:  Allergies  Allergen Reactions  . Sulfa Drugs Cross Reactors Hives  . Yaz [Drospirenone-Ethinyl Estradiol] Other (See Comments)    Headache    Prior to Admission medications   Medication Sig Start Date End Date Taking? Authorizing Provider  amLODipine-benazepril (LOTREL) 5-10 MG capsule Take 1 capsule by mouth daily.  04/22/19   Eustaquio Boyden, MD  omeprazole (PRILOSEC OTC) 20 MG tablet Take 20 mg by mouth daily.    [provider]  PARAGARD INTRAUTERINE COPPER IU by Intrauterine route.    [provider]  sertraline (ZOLOFT) 50 MG tablet Take 1 tablet (50 mg total) by mouth daily. 04/22/19   Eustaquio Boyden, MD     Blood pressure (!) 130/94, pulse 77, resp. rate 16, SpO2 94 %. Physical Exam: Physical Exam Constitutional:      General: She is not in acute  distress.    Appearance: She is well-developed. She is obese. She is ill-appearing. She is not toxic-appearing or diaphoretic.  HENT:     Head: Normocephalic and atraumatic.  Eyes:     General: No scleral icterus.    Comments: Pupils are equal  Cardiovascular:     Rate and Rhythm: Regular rhythm. Tachycardia present.     Heart sounds: Normal heart sounds. No murmur.  Pulmonary:     Effort: Pulmonary effort is normal.     Breath sounds: Normal breath sounds.     Comments: Pt is hyperventilating, but it is anxiety/pain related Abdominal:     General: Abdomen is flat. Bowel sounds are decreased. There is no distension.     Palpations: Abdomen is soft.     Tenderness: There is abdominal tenderness in the right upper quadrant and right lower quadrant.     Hernia: No hernia is present.     Comments: Most tender is her RUQ with pain going to her left shoulder, also pain RLQ.   Skin:    General: Skin is warm and dry.     Capillary Refill: Capillary refill takes less than 2 seconds.  Neurological:     General: No focal deficit present.     Mental Status: She is alert and oriented to person, place, and time.     Cranial Nerves: No cranial nerve deficit.  Psychiatric:        Mood and Affect: Mood is anxious.     Results for orders placed or performed during the hospital encounter of 09/14/19 (from the past 48 hour(s))  Lipase, blood     Status: None   Collection Time: 09/14/19 10:34 AM  Result Value Ref Range   Lipase 38 11 - 51 U/L    Comment: Performed at [facility], [address], [place], [place] [address]  Comprehensive metabolic panel     Status: Abnormal   Collection Time: 09/14/19 10:34 AM  Result Value Ref Range   Sodium 137 135 - 145 mmol/L   Potassium 3.9 3.5 - 5.1 mmol/L   Chloride 102 98 - 111 mmol/L   CO2 21 (L) 22 - 32 mmol/L   Glucose, Bld 122 (H) 70 - 99 mg/dL   BUN 14 6 - 20 mg/dL   Creatinine, Ser 1.91 0.44 - 1.00 mg/dL   Calcium 9.3 8.9 - 47.8 mg/dL    Total Protein 7.5 6.5 - 8.1 g/dL   Albumin 4.5 3.5 - 5.0 g/dL   AST 295 (H) 15 - 41 U/L    Comment: RESULTS CONFIRMED BY MANUAL DILUTION   ALT 79 (H) 0 - 44 U/L   Alkaline Phosphatase 75 38 - 126 U/L   Total Bilirubin 1.1 0.3 - 1.2 mg/dL   GFR calc non Af Amer >60 >60 mL/min   GFR calc Af Amer >60 >60 mL/min   Anion gap 14 5 - 15    Comment: Performed at [facility], [address] [place], [place] [address]  CBC     Status: Abnormal   Collection Time: 09/14/19 10:34 AM  Result Value Ref Range   WBC 11.4 (H) 4.0 - 10.5 K/uL   RBC 4.75 3.87 - 5.11 MIL/uL   Hemoglobin 15.5 (H) 12.0 - 15.0 g/dL   HCT 60.444.8 54.036.0 - 98.146.0 %   MCV 94.3 80.0 - 100.0 fL   MCH 32.6 26.0 - 34.0 pg   MCHC 34.6 30.0 - 36.0 g/dL   RDW 19.112.3 47.811.5 - 29.515.5 %   Platelets 278 150 - 400 K/uL   nRBC 0.0 0.0 - 0.2 %    Comment: Performed at [facility], [address] [place], [place] [address]  Urinalysis, Routine w reflex microscopic     Status: Abnormal   Collection Time: 09/14/19 10:34 AM  Result Value Ref Range   Color, Urine YELLOW YELLOW   APPearance HAZY (A) CLEAR   Specific Gravity, Urine 1.017 1.005 - 1.030   pH 7.0 5.0 - 8.0   Glucose, UA NEGATIVE NEGATIVE mg/dL   Hgb urine dipstick SMALL (A) NEGATIVE   Bilirubin Urine NEGATIVE NEGATIVE   Ketones, ur NEGATIVE NEGATIVE mg/dL   Protein, ur 30 (A) NEGATIVE mg/dL   Nitrite NEGATIVE NEGATIVE   Leukocytes,Ua NEGATIVE NEGATIVE   RBC / HPF 0-5 0 - 5 RBC/hpf   WBC, UA 0-5 0 - 5 WBC/hpf   Bacteria, UA NONE SEEN NONE SEEN   Squamous Epithelial / LPF 0-5 0 - 5    Comment: Performed at [facility], [address] [place], [place] [address]  I-Stat beta hCG blood, ED     Status: None   Collection Time: 09/14/19 11:15 AM  Result Value Ref Range   I-stat hCG, quantitative <5.0 <5 mIU/mL   Comment 3            Comment:   GEST. AGE      CONC.  (mIU/mL)   <=1 WEEK        5 - 50     2 WEEKS       50 - 500     3 WEEKS       100 - 10,000      4 WEEKS     1,000 - 30,000        FEMALE AND NON-PREGNANT FEMALE:     LESS THAN 5 mIU/mL    Ct Abdomen Pelvis W Contrast  Result Date: 09/14/2019 CLINICAL DATA:  Right upper quadrant pain. Cholecystitis suspected. Cholecystitis versus appendicitis versus diverticulitis versus stone. EXAM: CT ABDOMEN AND PELVIS WITH CONTRAST TECHNIQUE: Multidetector CT imaging of the abdomen and pelvis was performed using the standard protocol following bolus administration of intravenous contrast. CONTRAST:  100mL OMNIPAQUE IOHEXOL 300 MG/ML  SOLN COMPARISON:  None. FINDINGS: Lower chest: Mild dependent opacity in the right base is identified. Lower chest is otherwise normal. Hepatobiliary: The gallbladder demonstrates wall thickening and adjacent fat stranding. No stones are definitely seen. The superior most aspect of the liver dome was not included on today's study. Visualized portions of the liver are normal. Portal vein is patent. Pancreas: Unremarkable. No pancreatic ductal dilatation or surrounding inflammatory changes. Spleen: Normal in size without focal abnormality. Adrenals/Urinary Tract: Adrenal glands are unremarkable. Kidneys are normal, without renal calculi, focal lesion, or hydronephrosis. Bladder is unremarkable. Stomach/Bowel: The stomach, small bowel, and colon are normal. Visualized portions of the appendix are normal with no evidence of appendicitis.  Vascular/Lymphatic: No significant vascular findings are present. No enlarged abdominal or pelvic lymph nodes. Reproductive: An IUD is identified. The uterus and ovaries are unremarkable. Other: There is fluid adjacent to the liver, in the right pericolic gutter, and in the pelvis. Musculoskeletal: No acute or significant osseous findings. IMPRESSION: 1. The gallbladder is distended with a thickened wall and adjacent fat stranding. The findings are consistent with acute cholecystitis. No stones are seen on this study. Ultrasound could better evaluate if  clinically warranted. 2. The fluid in the abdomen and pelvis is probably reactive from significant cholecystitis. Bile from a perforated cholecystitis is considered less likely. However, if there is concern, a nuclear medicine biliary leak study could be performed. Electronically Signed   By: Dorise Bullion III M.D   On: 09/14/2019 14:59      Assessment/Plan Hypertension Hx of migraines  Acute cholecystitis -   FEN:  IV fluids/clears as tolerated/NPO after MN ID:  Rocephin/Flagyl DVT:  SCD's/heparin Follow up:  TBD  Plan:  Admit, [place] RUQ pending, IV fluids, antibiotics, pain control, BP control. Aim for surgery in the AM.     Earnstine Regal [facility] Surgery 09/14/2019, 3:04 PM Please see Amion for pager number during day hours 7:00am-4:30pm

## 2019-09-15 ENCOUNTER — Observation Stay (HOSPITAL_COMMUNITY): Payer: Managed Care, Other (non HMO) | Admitting: Certified Registered"

## 2019-09-15 ENCOUNTER — Other Ambulatory Visit: Payer: Self-pay

## 2019-09-15 ENCOUNTER — Observation Stay (HOSPITAL_COMMUNITY): Payer: Managed Care, Other (non HMO)

## 2019-09-15 ENCOUNTER — Encounter (HOSPITAL_COMMUNITY)
Admission: EM | Disposition: A | Discharge status: Home / Self Care | Payer: Self-pay | Source: Home / Self Care | Attending: Emergency Medicine

## 2019-09-15 HISTORY — PX: CHOLECYSTECTOMY: SHX55

## 2019-09-15 LAB — COMPREHENSIVE METABOLIC PANEL
ALT: 95 U/L — ABNORMAL HIGH (ref 0–44)
AST: 177 U/L — ABNORMAL HIGH (ref 15–41)
Albumin: 3.7 g/dL (ref 3.5–5.0)
Alkaline Phosphatase: 68 U/L (ref 38–126)
Anion gap: 13 (ref 5–15)
BUN: 10 mg/dL (ref 6–20)
CO2: 21 mmol/L — ABNORMAL LOW (ref 22–32)
Calcium: 8.6 mg/dL — ABNORMAL LOW (ref 8.9–10.3)
Chloride: 102 mmol/L (ref 98–111)
Creatinine, Ser: 0.76 mg/dL (ref 0.44–1.00)
GFR calc Af Amer: >60 mL/min (ref >60)
GFR calc non Af Amer: >60 mL/min (ref >60)
Glucose, Bld: 132 mg/dL — ABNORMAL HIGH (ref 70–99)
Potassium: 3.9 mmol/L (ref 3.5–5.1)
Sodium: 136 mmol/L (ref 135–145)
Total Bilirubin: 1.7 mg/dL — ABNORMAL HIGH (ref 0.3–1.2)
Total Protein: 6.5 g/dL (ref 6.5–8.1)

## 2019-09-15 LAB — CBC
HCT: 44.8 % (ref 36.0–46.0)
Hemoglobin: 15.3 g/dL — ABNORMAL HIGH (ref 12.0–15.0)
MCH: 32.6 pg (ref 26.0–34.0)
MCHC: 34.2 g/dL (ref 30.0–36.0)
MCV: 95.5 fL (ref 80.0–100.0)
Platelets: 236 10*3/uL (ref 150–400)
RBC: 4.69 MIL/uL (ref 3.87–5.11)
RDW: 12.5 % (ref 11.5–15.5)
WBC: 15.9 10*3/uL — ABNORMAL HIGH (ref 4.0–10.5)
nRBC: 0 % (ref 0.0–0.2)

## 2019-09-15 LAB — SURGICAL PCR SCREEN
MRSA, PCR: NEGATIVE
Staphylococcus aureus: NEGATIVE

## 2019-09-15 LAB — LIPASE, BLOOD: Lipase: 23 U/L (ref 11–51)

## 2019-09-15 SURGERY — LAPAROSCOPIC CHOLECYSTECTOMY WITH INTRAOPERATIVE CHOLANGIOGRAM
Anesthesia: General | Site: Abdomen

## 2019-09-15 MED ORDER — KETOROLAC TROMETHAMINE 30 MG/ML IJ SOLN
30.0000 mg | Freq: Once | INTRAMUSCULAR | Status: AC | PRN
  Administered 2019-09-15: 30 mg via INTRAVENOUS

## 2019-09-15 MED ORDER — BUPIVACAINE HCL (PF) 0.25 % IJ SOLN
INTRAMUSCULAR | Status: AC
  Filled 2019-09-15: qty 30

## 2019-09-15 MED ORDER — FENTANYL CITRATE (PF) 250 MCG/5ML IJ SOLN
INTRAMUSCULAR | Status: AC
  Filled 2019-09-15: qty 5

## 2019-09-15 MED ORDER — FENTANYL CITRATE (PF) 250 MCG/5ML IJ SOLN
INTRAMUSCULAR | Status: DC | PRN
  Administered 2019-09-15: 50 ug via INTRAVENOUS
  Administered 2019-09-15 (×2): 100 ug via INTRAVENOUS

## 2019-09-15 MED ORDER — OXYCODONE HCL 5 MG/5ML PO SOLN: 5.0000 mg | Freq: Once | ORAL | Status: DC | PRN

## 2019-09-15 MED ORDER — ACETAMINOPHEN 500 MG PO TABS
1000.0000 mg | ORAL_TABLET | Freq: Three times a day (TID) | ORAL | Status: DC
  Administered 2019-09-15 – 2019-09-16 (×3): 1000 mg via ORAL
  Filled 2019-09-15 (×3): qty 2

## 2019-09-15 MED ORDER — 0.9 % SODIUM CHLORIDE (POUR BTL) OPTIME
TOPICAL | Status: DC | PRN
  Administered 2019-09-15: 1000 mL

## 2019-09-15 MED ORDER — MEPERIDINE HCL 25 MG/ML IJ SOLN: 6.2500 mg | INTRAMUSCULAR | Status: DC | PRN

## 2019-09-15 MED ORDER — SUGAMMADEX SODIUM 200 MG/2ML IV SOLN
INTRAVENOUS | Status: DC | PRN
  Administered 2019-09-15: 200 mg via INTRAVENOUS

## 2019-09-15 MED ORDER — ONDANSETRON HCL 4 MG/2ML IJ SOLN
INTRAMUSCULAR | Status: AC
  Filled 2019-09-15: qty 2

## 2019-09-15 MED ORDER — HYDROMORPHONE HCL 1 MG/ML IJ SOLN
INTRAMUSCULAR | Status: AC
  Filled 2019-09-15: qty 1

## 2019-09-15 MED ORDER — LACTATED RINGERS IV SOLN
INTRAVENOUS | Status: DC
  Administered 2019-09-15: 12:00:00 via INTRAVENOUS

## 2019-09-15 MED ORDER — SODIUM CHLORIDE 0.9 % IV SOLN
INTRAVENOUS | Status: DC
  Administered 2019-09-15 (×2): via INTRAVENOUS

## 2019-09-15 MED ORDER — ROCURONIUM BROMIDE 10 MG/ML (PF) SYRINGE
PREFILLED_SYRINGE | INTRAVENOUS | Status: DC | PRN
  Administered 2019-09-15: 10 mg via INTRAVENOUS
  Administered 2019-09-15: 50 mg via INTRAVENOUS

## 2019-09-15 MED ORDER — MIDAZOLAM HCL 2 MG/2ML IJ SOLN
INTRAMUSCULAR | Status: AC
  Filled 2019-09-15: qty 2

## 2019-09-15 MED ORDER — DEXAMETHASONE SODIUM PHOSPHATE 10 MG/ML IJ SOLN
INTRAMUSCULAR | Status: AC
  Filled 2019-09-15: qty 1

## 2019-09-15 MED ORDER — PROPOFOL 10 MG/ML IV BOLUS
INTRAVENOUS | Status: DC | PRN
  Administered 2019-09-15: 200 mg via INTRAVENOUS

## 2019-09-15 MED ORDER — LACTATED RINGERS IV SOLN
INTRAVENOUS | Status: DC | PRN
  Administered 2019-09-15 (×2): via INTRAVENOUS

## 2019-09-15 MED ORDER — HEMOSTATIC AGENTS (NO CHARGE) OPTIME
TOPICAL | Status: DC | PRN
  Administered 2019-09-15: 1 via TOPICAL

## 2019-09-15 MED ORDER — ONDANSETRON HCL 4 MG/2ML IJ SOLN
INTRAMUSCULAR | Status: DC | PRN
  Administered 2019-09-15: 4 mg via INTRAVENOUS

## 2019-09-15 MED ORDER — DEXAMETHASONE SODIUM PHOSPHATE 10 MG/ML IJ SOLN
INTRAMUSCULAR | Status: DC | PRN
  Administered 2019-09-15: 4 mg via INTRAVENOUS

## 2019-09-15 MED ORDER — OXYCODONE HCL 5 MG PO TABS: 5.0000 mg | ORAL_TABLET | Freq: Once | ORAL | Status: DC | PRN

## 2019-09-15 MED ORDER — MIDAZOLAM HCL 5 MG/5ML IJ SOLN
INTRAMUSCULAR | Status: DC | PRN
  Administered 2019-09-15: 2 mg via INTRAVENOUS

## 2019-09-15 MED ORDER — HYDROMORPHONE HCL 1 MG/ML IJ SOLN
INTRAMUSCULAR | Status: AC
  Filled 2019-09-15: qty 0.5

## 2019-09-15 MED ORDER — HYDROMORPHONE HCL 1 MG/ML IJ SOLN
INTRAMUSCULAR | Status: DC | PRN
  Administered 2019-09-15 (×2): 0.5 mg via INTRAVENOUS

## 2019-09-15 MED ORDER — PROMETHAZINE HCL 25 MG/ML IJ SOLN: 6.2500 mg | INTRAMUSCULAR | Status: DC | PRN

## 2019-09-15 MED ORDER — SERTRALINE HCL 50 MG PO TABS
50.0000 mg | ORAL_TABLET | Freq: Every day | ORAL | Status: DC
  Administered 2019-09-15 – 2019-09-16 (×2): 50 mg via ORAL
  Filled 2019-09-15 (×2): qty 1

## 2019-09-15 MED ORDER — LIDOCAINE 2% (20 MG/ML) 5 ML SYRINGE
INTRAMUSCULAR | Status: DC | PRN
  Administered 2019-09-15: 40 mg via INTRAVENOUS

## 2019-09-15 MED ORDER — BUPIVACAINE HCL 0.25 % IJ SOLN
INTRAMUSCULAR | Status: DC | PRN
  Administered 2019-09-15: 15 mL

## 2019-09-15 MED ORDER — ENOXAPARIN SODIUM 40 MG/0.4ML ~~LOC~~ SOLN: 40.0000 mg | SUBCUTANEOUS | Status: DC

## 2019-09-15 MED ORDER — HYDROMORPHONE HCL 1 MG/ML IJ SOLN
0.2500 mg | INTRAMUSCULAR | Status: DC | PRN
  Administered 2019-09-15 (×2): 0.5 mg via INTRAVENOUS

## 2019-09-15 MED ORDER — SCOPOLAMINE 1 MG/3DAYS TD PT72: 1.0000 | MEDICATED_PATCH | TRANSDERMAL | Status: DC

## 2019-09-15 MED ORDER — LIDOCAINE 2% (20 MG/ML) 5 ML SYRINGE
INTRAMUSCULAR | Status: AC
  Filled 2019-09-15: qty 5

## 2019-09-15 MED ORDER — SODIUM CHLORIDE 0.9 % IV SOLN
INTRAVENOUS | Status: DC | PRN
  Administered 2019-09-15: 8 mL

## 2019-09-15 MED ORDER — KETOROLAC TROMETHAMINE 30 MG/ML IJ SOLN
INTRAMUSCULAR | Status: AC
  Filled 2019-09-15: qty 1

## 2019-09-15 MED ORDER — PROPOFOL 10 MG/ML IV BOLUS
INTRAVENOUS | Status: AC
  Filled 2019-09-15: qty 20

## 2019-09-15 MED ORDER — SODIUM CHLORIDE 0.9 % IR SOLN
Status: DC | PRN
  Administered 2019-09-15: 1000 mL

## 2019-09-15 MED ORDER — ROCURONIUM BROMIDE 10 MG/ML (PF) SYRINGE
PREFILLED_SYRINGE | INTRAVENOUS | Status: AC
  Filled 2019-09-15: qty 10

## 2019-09-15 SURGICAL SUPPLY — 47 items
APPLIER CLIP ROT 10 11.4 M/L (STAPLE) ×3
BENZOIN TINCTURE PRP APPL 2/3 (GAUZE/BANDAGES/DRESSINGS) ×3 IMPLANT
BLADE CLIPPER SURG (BLADE) ×3 IMPLANT
CANISTER SUCT 3000ML PPV (MISCELLANEOUS) ×3 IMPLANT
CHLORAPREP W/TINT 26 (MISCELLANEOUS) ×3 IMPLANT
CLIP APPLIE ROT 10 11.4 M/L (STAPLE) ×1 IMPLANT
CLOSURE WOUND 1/2 X4 (GAUZE/BANDAGES/DRESSINGS) ×1
COVER MAYO STAND STRL (DRAPES) ×3 IMPLANT
COVER SURGICAL LIGHT HANDLE (MISCELLANEOUS) ×3 IMPLANT
COVER WAND RF STERILE (DRAPES) IMPLANT
DRAPE C-ARM 42X120 X-RAY (DRAPES) ×3 IMPLANT
DRSG TEGADERM 2-3/8X2-3/4 SM (GAUZE/BANDAGES/DRESSINGS) ×9 IMPLANT
DRSG TEGADERM 4X4.75 (GAUZE/BANDAGES/DRESSINGS) ×3 IMPLANT
ELECT REM PT RETURN 9FT ADLT (ELECTROSURGICAL) ×3
ELECTRODE REM PT RTRN 9FT ADLT (ELECTROSURGICAL) ×1 IMPLANT
GAUZE SPONGE 2X2 8PLY STRL LF (GAUZE/BANDAGES/DRESSINGS) ×1 IMPLANT
GLOVE BIO SURGEON STRL SZ7 (GLOVE) ×3 IMPLANT
GLOVE BIOGEL PI IND STRL 7.5 (GLOVE) ×1 IMPLANT
GLOVE BIOGEL PI INDICATOR 7.5 (GLOVE) ×2
GOWN STRL REUS W/ TWL LRG LVL3 (GOWN DISPOSABLE) ×3 IMPLANT
GOWN STRL REUS W/TWL LRG LVL3 (GOWN DISPOSABLE) ×6
HEMOSTAT SNOW SURGICEL 2X4 (HEMOSTASIS) ×3 IMPLANT
KIT BASIN OR (CUSTOM PROCEDURE TRAY) ×3 IMPLANT
KIT TURNOVER KIT B (KITS) ×3 IMPLANT
NS IRRIG 1000ML POUR BTL (IV SOLUTION) ×3 IMPLANT
PAD ARMBOARD 7.5X6 YLW CONV (MISCELLANEOUS) ×3 IMPLANT
POUCH RETRIEVAL ECOSAC 10 (ENDOMECHANICALS) ×1 IMPLANT
POUCH RETRIEVAL ECOSAC 10MM (ENDOMECHANICALS) ×2
POUCH SPECIMEN RETRIEVAL 10MM (ENDOMECHANICALS) IMPLANT
SCISSORS LAP 5X35 DISP (ENDOMECHANICALS) ×3 IMPLANT
SET CHOLANGIOGRAPH 5 50 .035 (SET/KITS/TRAYS/PACK) ×3 IMPLANT
SET IRRIG TUBING LAPAROSCOPIC (IRRIGATION / IRRIGATOR) ×3 IMPLANT
SET TUBE SMOKE EVAC HIGH FLOW (TUBING) ×3 IMPLANT
SLEEVE ENDOPATH XCEL 5M (ENDOMECHANICALS) ×3 IMPLANT
SPECIMEN JAR SMALL (MISCELLANEOUS) ×3 IMPLANT
SPONGE GAUZE 2X2 8PLY STER LF (GAUZE/BANDAGES/DRESSINGS) ×1
SPONGE GAUZE 2X2 8PLY STRL LF (GAUZE/BANDAGES/DRESSINGS) ×2 IMPLANT
SPONGE GAUZE 2X2 STER 10/PKG (GAUZE/BANDAGES/DRESSINGS) ×2
STRIP CLOSURE SKIN 1/2X4 (GAUZE/BANDAGES/DRESSINGS) ×2 IMPLANT
SUT MNCRL AB 4-0 PS2 18 (SUTURE) ×3 IMPLANT
TOWEL GREEN STERILE (TOWEL DISPOSABLE) ×3 IMPLANT
TOWEL GREEN STERILE FF (TOWEL DISPOSABLE) ×3 IMPLANT
TRAY LAPAROSCOPIC MC (CUSTOM PROCEDURE TRAY) ×3 IMPLANT
TROCAR XCEL BLUNT TIP 100MML (ENDOMECHANICALS) ×3 IMPLANT
TROCAR XCEL NON-BLD 11X100MML (ENDOMECHANICALS) ×3 IMPLANT
TROCAR XCEL NON-BLD 5MMX100MML (ENDOMECHANICALS) ×3 IMPLANT
WATER STERILE IRR 1000ML POUR (IV SOLUTION) ×3 IMPLANT

## 2019-09-15 NOTE — Anesthesia Procedure Notes (Signed)
Procedure Name: Intubation Date/Time: 09/15/2019 1:23 PM Performed by: Amadeo Garnet, CRNA Pre-anesthesia Checklist: Emergency Drugs available, Patient identified, Suction available and Patient being monitored Patient Re-evaluated:Patient Re-evaluated prior to induction Oxygen Delivery Method: Circle system utilized Preoxygenation: Pre-oxygenation with 100% oxygen Induction Type: IV induction Ventilation: Mask ventilation without difficulty Laryngoscope Size: Mac and 3 Grade View: Grade I Tube type: Oral Tube size: 7.0 mm Number of attempts: 1 Airway Equipment and Method: Stylet Placement Confirmation: ETT inserted through vocal cords under direct vision,  positive ETCO2 and breath sounds checked- equal and bilateral Secured at: 22 cm Tube secured with: Tape Dental Injury: Teeth and Oropharynx as per pre-operative assessment

## 2019-09-15 NOTE — Op Note (Signed)
Laparoscopic Cholecystectomy with IOC Procedure Note  Indications: This patient presents with symptomatic gallbladder disease and will undergo laparoscopic cholecystectomy.  Pre-operative Diagnosis: Acute cholecystitis Post-operative Diagnosis: Same  Surgeon: Maia Petties   Assistants: None  Anesthesia: General endotracheal anesthesia  ASA Class: 2  Procedure Details  The patient was seen again in the Holding Room. The risks, benefits, complications, treatment options, and expected outcomes were discussed with the patient. The possibilities of reaction to medication, pulmonary aspiration, perforation of viscus, bleeding, recurrent infection, finding a normal gallbladder, the need for additional procedures, failure to diagnose a condition, the possible need to convert to an open procedure, and creating a complication requiring transfusion or operation were discussed with the patient. The likelihood of improving the patient's symptoms with return to their baseline status is good.  The patient and/or family concurred with the proposed plan, giving informed consent. The site of surgery properly noted. The patient was taken to Operating Room, identified as Jennifer Mays and the procedure verified as Laparoscopic Cholecystectomy with Intraoperative Cholangiogram. A Time Out was held and the above information confirmed.  Prior to the induction of general anesthesia, antibiotic prophylaxis was administered. General endotracheal anesthesia was then administered and tolerated well. After the induction, the abdomen was prepped with Chloraprep and draped in the sterile fashion. The patient was positioned in the supine position.  Local anesthetic agent was injected into the skin below the umbilicus and an incision made. We dissected down to the abdominal fascia with blunt dissection.  The fascia was incised vertically and we entered the peritoneal cavity bluntly.  A pursestring suture of 0-Vicryl was  placed around the fascial opening.  The Hasson cannula was inserted and secured with the stay suture.  Pneumoperitoneum was then created with CO2 and tolerated well without any adverse changes in the patient's vital signs. An 11-mm port was placed in the subxiphoid position.  Two 5-mm ports were placed in the right upper quadrant. All skin incisions were infiltrated with a local anesthetic agent before making the incision and placing the trocars.   We positioned the patient in reverse Trendelenburg, tilted slightly to the patient's left.  The patient has a moderate amount of bilious ascites in the abdomen.  This was suctioned out. The gallbladder was identified, the fundus grasped and retracted cephalad. The gallbladder is quite thickened and distended and required decompression with the suction aspiration device.  Adhesions were lysed bluntly and with the electrocautery where indicated, taking care not to injure any adjacent organs or viscus. The infundibulum was grasped and retracted laterally, exposing the peritoneum overlying the triangle of Calot. This was then divided and exposed in a blunt fashion. A critical view of the cystic duct and cystic artery was obtained.  The cystic duct was clearly identified and bluntly dissected circumferentially. The cystic duct was ligated with a clip distally.   An incision was made in the cystic duct and the [facility] cholangiogram catheter introduced. The catheter was secured using a clip. A cholangiogram was then obtained which showed good visualization of the distal and proximal biliary tree with no sign of filling defects or obstruction.  Contrast flowed easily into the duodenum. The catheter was then removed.   The cystic duct was then ligated with clips and divided. The cystic artery was identified, dissected free, ligated with clips and divided as well.   The gallbladder was dissected from the liver bed in retrograde fashion with the electrocautery. The gallbladder  was removed and placed in an  Eco sac. The liver bed was irrigated and inspected. Hemostasis was achieved with the electrocautery and SNOW. Copious irrigation was utilized and was repeatedly aspirated until clear.  The gallbladder and Eco sac were then removed through the umbilical port site.  The pursestring suture was used to close the umbilical fascia.    We again inspected the right upper quadrant for hemostasis.  Pneumoperitoneum was released as we removed the trocars.  4-0 Monocryl was used to close the skin.   Benzoin, steri-strips, and clean dressings were applied. The patient was then extubated and brought to the recovery room in stable condition. Instrument, sponge, and needle counts were correct at closure and at the conclusion of the case.   Findings: Severely thickened and inflamed gallbladder with surrounding bilious ascites  Estimated Blood Loss: less than 50 mL         Drains: none         Specimens: Gallbladder           Complications: None; patient tolerated the procedure well.         Disposition: PACU - hemodynamically stable.         Condition: stable  [name]. [name], MD, [facility] Surgery  General/ Trauma Surgery   09/15/2019 2:36 PM

## 2019-09-15 NOTE — Progress Notes (Signed)
Day of Surgery   Subjective/Chief Complaint: Patient still tender in RUQ No nausea or vomiting   Objective: Vital signs in last 24 hours: Temp:  [98.8 F (37.1 C)-98.9 F (37.2 C)] 98.8 F (37.1 C) (12/09 0420) Pulse Rate:  [77-110] 100 (12/09 0420) Resp:  [16-22] 16 (12/09 0420) BP: (123-158)/(83-114) 124/89 (12/09 0420) SpO2:  [94 %-100 %] 97 % (12/09 0420) Last BM Date: 09/14/19  Intake/Output from previous day: 12/08 0701 - 12/09 0700 In: 2464.5 [I.V.:1164.5; IV Piggyback:1300] Out: -  Intake/Output this shift: No intake/output data recorded.  General appearance: alert, cooperative and no distress GI: RUQ tenderness; no palpable masses  Lab Results:  Recent Labs    09/14/19 1034 09/15/19 0202  WBC 11.4* 15.9*  HGB 15.5* 15.3*  HCT 44.8 44.8  PLT 278 236   BMET Recent Labs    09/14/19 1034 09/15/19 0202  NA 137 136  K 3.9 3.9  CL 102 102  CO2 21* 21*  GLUCOSE 122* 132*  BUN 14 10  CREATININE 0.84 0.76  CALCIUM 9.3 8.6*   PT/INR No results for input(s): LABPROT, INR in the last 72 hours. ABG No results for input(s): PHART, HCO3 in the last 72 hours.  Invalid input(s): PCO2, PO2  Studies/Results: Ct Abdomen Pelvis W Contrast  Result Date: 09/14/2019 CLINICAL DATA:  Right upper quadrant pain. Cholecystitis suspected. Cholecystitis versus appendicitis versus diverticulitis versus stone. EXAM: CT ABDOMEN AND PELVIS WITH CONTRAST TECHNIQUE: Multidetector CT imaging of the abdomen and pelvis was performed using the standard protocol following bolus administration of intravenous contrast. CONTRAST:  OMNIPAQUE IOHEXOL 300 MG/ML  SOLN COMPARISON:  None. FINDINGS: Lower chest: Mild dependent opacity in the right base is identified. Lower chest is otherwise normal. Hepatobiliary: The gallbladder demonstrates wall thickening and adjacent fat stranding. No stones are definitely seen. The superior most aspect of the liver dome was not included on today's  study. Visualized portions of the liver are normal. Portal vein is patent. Pancreas: Unremarkable. No pancreatic ductal dilatation or surrounding inflammatory changes. Spleen: Normal in size without focal abnormality. Adrenals/Urinary Tract: Adrenal glands are unremarkable. Kidneys are normal, without renal calculi, focal lesion, or hydronephrosis. Bladder is unremarkable. Stomach/Bowel: The stomach, small bowel, and colon are normal. Visualized portions of the appendix are normal with no evidence of appendicitis. Vascular/Lymphatic: No significant vascular findings are present. No enlarged abdominal or pelvic lymph nodes. Reproductive: An IUD is identified. The uterus and ovaries are unremarkable. Other: There is fluid adjacent to the liver, in the right pericolic gutter, and in the pelvis. Musculoskeletal: No acute or significant osseous findings. IMPRESSION: 1. The gallbladder is distended with a thickened wall and adjacent fat stranding. The findings are consistent with acute cholecystitis. No stones are seen on this study. Ultrasound could better evaluate if clinically warranted. 2. The fluid in the abdomen and pelvis is probably reactive from significant cholecystitis. Bile from a perforated cholecystitis is considered less likely. However, if there is concern, a nuclear medicine biliary leak study could be performed. Electronically Signed   By: Gerome Sam III M.D   On: 09/14/2019 14:59   US Abdomen Limited Ruq  Result Date: 09/14/2019 CLINICAL DATA:  Abnormal gallbladder seen on CT imaging. EXAM: ULTRASOUND ABDOMEN LIMITED RIGHT UPPER QUADRANT COMPARISON:  None. FINDINGS: Gallbladder: Gallbladder wall thickening is seen measuring 6.6 mm. No stones, sludge, Murphy's sign, or pericholecystic fluid identified. Common bile duct: Diameter: 4.5 mm Liver: No focal lesion identified. Within normal limits in parenchymal echogenicity. Portal vein is  patent on color Doppler imaging with normal direction of  blood flow towards the liver. Other: Perihepatic ascites identified. IMPRESSION: 1. The gallbladder wall is thickened to 6.6 mm. However, no stones, sludge, pericholecystic fluid, or Murphy's sign is identified. The CT findings are very concerning for acute cholecystitis given wall thickening and significant adjacent fat stranding. However, the ultrasound findings are nonspecific, somewhat surprisingly. A HIDA scan could be performed if the clinical picture is ambiguous. Electronically Signed   By: Dorise Bullion III M.D   On: 09/14/2019 17:02    Anti-infectives: Anti-infectives (From admission, onward)   Start     Dose/Rate Route Frequency Ordered Stop   09/14/19 1615  metroNIDAZOLE (FLAGYL) IVPB 500 mg     500 mg 100 mL/hr over 60 Minutes Intravenous Every 8 hours 09/14/19 1600     09/14/19 1515  cefTRIAXone (ROCEPHIN) 2 g in sodium chloride 0.9 % 100 mL IVPB     2 g 200 mL/hr over 30 Minutes Intravenous Every 24 hours 09/14/19 1514        Assessment/Plan: Acute cholecystitis - no obvious stones on [place] or CT.   Plan lap chole with IOC today.  The surgical procedure has been discussed with the patient.  Potential risks, benefits, alternative treatments, and expected outcomes have been explained.  All of the patient's questions at this time have been answered.  The likelihood of reaching the patient's treatment goal is good.  The patient understand the proposed surgical procedure and wishes to proceed.   LOS: 0 days    Maia Petties 09/15/2019

## 2019-09-15 NOTE — Anesthesia Postprocedure Evaluation (Signed)
Anesthesia Post Note  Patient: Jennifer Mays  Procedure(s) Performed: LAPAROSCOPIC CHOLECYSTECTOMY WITH INTRAOPERATIVE CHOLANGIOGRAM (N/A Abdomen)     Patient location during evaluation: PACU Anesthesia Type: General Level of consciousness: awake and alert, oriented and patient cooperative Pain management: pain level controlled Vital Signs Assessment: post-procedure vital signs reviewed and stable Respiratory status: spontaneous breathing, nonlabored ventilation and respiratory function stable Cardiovascular status: blood pressure returned to baseline and stable Postop Assessment: no apparent nausea or vomiting Anesthetic complications: no    Last Vitals:  Vitals:   09/15/19 0420 09/15/19 1126  BP: 124/89 123/87  Pulse: 100 81  Resp: 16 17  Temp: 37.1 C 36.7 C  SpO2: 97% 100%    Last Pain:  Vitals:   09/15/19 1126  TempSrc: Oral  PainSc:                  Pervis Hocking

## 2019-09-15 NOTE — Progress Notes (Signed)
Received patient from ED, AOx4, ambulatory,  VSS with elevated BP at 139/107 & PR at 110 due to pain at abdominal pain at10/10.  Oriented to room, bed controls, call light and plan of care.  Pre-op procedures done and administered PTN pain medication Oxy IR 10 mg with sips of water. Pt. now resting on bed comfortably after CHG bath.  Will monitor.

## 2019-09-15 NOTE — Transfer of Care (Signed)
Immediate Anesthesia Transfer of Care Note  Patient: Jennifer Mays  Procedure(s) Performed: LAPAROSCOPIC CHOLECYSTECTOMY WITH INTRAOPERATIVE CHOLANGIOGRAM (N/A Abdomen)  Patient Location: PACU  Anesthesia Type:General  Level of Consciousness: awake, alert  and oriented  Airway & Oxygen Therapy: Patient Spontanous Breathing  Post-op Assessment: Report given to RN, Post -op Vital signs reviewed and stable and Patient moving all extremities  Post vital signs: Reviewed and stable  Last Vitals:  Vitals Value Taken Time  BP 134/95 09/15/19 1445  Temp    Pulse 98 09/15/19 1446  Resp 18 09/15/19 1446  SpO2 100 % 09/15/19 1446  Vitals shown include unvalidated device data.  Last Pain:  Vitals:   09/15/19 1126  TempSrc: Oral  PainSc:       Patients Stated Pain Goal: 2 (49/20/10 0712)  Complications: No apparent anesthesia complications

## 2019-09-15 NOTE — Anesthesia Preprocedure Evaluation (Addendum)
Anesthesia Evaluation  Patient identified by MRN, date of birth, ID band Patient awake    Reviewed: Allergy & Precautions, NPO status , Patient's Chart, lab work & pertinent test results  Airway Mallampati: II  TM Distance: >3 FB Neck ROM: Full    Dental  (+) Teeth Intact, Dental Advisory Given   Pulmonary former smoker,  Quit smoking 2014   Pulmonary exam normal breath sounds clear to auscultation       Cardiovascular hypertension, Pt. on medications negative cardio ROS Normal cardiovascular exam Rhythm:Regular Rate:Normal     Neuro/Psych  Headaches, PSYCHIATRIC DISORDERS Anxiety    GI/Hepatic Neg liver ROS, GERD  Medicated and Controlled,  Endo/Other  negative endocrine ROS  Renal/GU negative Renal ROS  negative genitourinary   Musculoskeletal negative musculoskeletal ROS (+)   Abdominal Normal abdominal exam  (+)   Peds negative pediatric ROS (+)  Hematology negative hematology ROS (+)   Anesthesia Other Findings   Reproductive/Obstetrics negative OB ROS                          Anesthesia Physical Anesthesia Plan  ASA: II  Anesthesia Plan: General   Post-op Pain Management:    Induction: Intravenous  PONV Risk Score and Plan: 3 and Ondansetron, Dexamethasone, Midazolam and Treatment may vary due to age or medical condition  Airway Management Planned: Oral ETT  Additional Equipment: None  Intra-op Plan:   Post-operative Plan: Extubation in OR  Informed Consent: I have reviewed the patients History and Physical, chart, labs and discussed the procedure including the risks, benefits and alternatives for the proposed anesthesia with the patient or authorized representative who has indicated his/her understanding and acceptance.     Dental advisory given  Plan Discussed with: CRNA  Anesthesia Plan Comments:         Anesthesia Quick Evaluation

## 2019-09-16 LAB — COMPREHENSIVE METABOLIC PANEL
ALT: 122 U/L — ABNORMAL HIGH (ref 0–44)
AST: 168 U/L — ABNORMAL HIGH (ref 15–41)
Albumin: 2.9 g/dL — ABNORMAL LOW (ref 3.5–5.0)
Alkaline Phosphatase: 90 U/L (ref 38–126)
Anion gap: 9 (ref 5–15)
BUN: 8 mg/dL (ref 6–20)
CO2: 23 mmol/L (ref 22–32)
Calcium: 8.2 mg/dL — ABNORMAL LOW (ref 8.9–10.3)
Chloride: 104 mmol/L (ref 98–111)
Creatinine, Ser: 0.79 mg/dL (ref 0.44–1.00)
GFR calc Af Amer: >60 mL/min (ref >60)
GFR calc non Af Amer: >60 mL/min (ref >60)
Glucose, Bld: 121 mg/dL — ABNORMAL HIGH (ref 70–99)
Potassium: 3.8 mmol/L (ref 3.5–5.1)
Sodium: 136 mmol/L (ref 135–145)
Total Bilirubin: 1.7 mg/dL — ABNORMAL HIGH (ref 0.3–1.2)
Total Protein: 5.6 g/dL — ABNORMAL LOW (ref 6.5–8.1)

## 2019-09-16 LAB — CBC
HCT: 36.6 % (ref 36.0–46.0)
Hemoglobin: 12.2 g/dL (ref 12.0–15.0)
MCH: 33.2 pg (ref 26.0–34.0)
MCHC: 33.3 g/dL (ref 30.0–36.0)
MCV: 99.5 fL (ref 80.0–100.0)
Platelets: 186 10*3/uL (ref 150–400)
RBC: 3.68 MIL/uL — ABNORMAL LOW (ref 3.87–5.11)
RDW: 12.8 % (ref 11.5–15.5)
WBC: 13 10*3/uL — ABNORMAL HIGH (ref 4.0–10.5)
nRBC: 0 % (ref 0.0–0.2)

## 2019-09-16 LAB — SURGICAL PATHOLOGY

## 2019-09-16 MED ORDER — OXYCODONE HCL 5 MG PO TABS: 5.0000 mg | ORAL_TABLET | Freq: Four times a day (QID) | ORAL | 0 refills | Status: DC | PRN

## 2019-09-16 MED ORDER — METRONIDAZOLE 500 MG PO TABS: 500.0000 mg | ORAL_TABLET | Freq: Three times a day (TID) | ORAL | 0 refills | Status: AC

## 2019-09-16 MED ORDER — CIPROFLOXACIN HCL 500 MG PO TABS: 500.0000 mg | ORAL_TABLET | Freq: Two times a day (BID) | ORAL | 0 refills | Status: AC

## 2019-09-16 MED ORDER — IBUPROFEN 200 MG PO TABS: ORAL_TABLET | ORAL | Status: DC

## 2019-09-16 MED ORDER — ACETAMINOPHEN 500 MG PO TABS: ORAL_TABLET | ORAL | 0 refills | Status: DC

## 2019-09-16 NOTE — Discharge Summary (Signed)
Physician Discharge Summary  Patient ID: Jennifer Mays MRN: [id number] DOB/AGE: [birth year] 30 y.o.  Admit date: 09/14/2019 Discharge date: 09/16/2019  Admission Diagnoses:  Acute cholecystitis Hypertension Checks migraines  Discharge Diagnoses:  Same Active Problems:   Acute cholecystitis   PROCEDURES: Laparoscopic cholecystectomy with intraoperative cholangiogram, 09/15/2019 Dr. Candice Camp Course: Patient is a 28 year old female presents to the ED with onset of mid back pain that progressed to both the left and the right side with multiple episode nausea and vomiting. Pain started this AM. She described the pain as 10/10.  She is noted to be hyperventilating and crying in the hallways prior to pain medication. She still has significant pain in the RUQ, even after 3 doses of dilaudid.    Work-up in the ED showed CMP is normal except for a CO2 of 21, glucose of 122, and AST of 352, and ALT of 79.  Remainder the CMP was normal.  Creatinine 0.84, total bilirubin 1.1.  Lactate 3.2. WBC 11.4, hemoglobin 15.5, hematocrit 44.8, platelets 278,000. CT scan obtained 12/8: Shows the gallbladder is distended with thickened wall and adjacent fat stranding.  Findings are consistent with acute cholecystitis.  No stones were seen on the study.  Fluid in the abdomen pelvis was probably reactive from significant cholecystitis bowel from a perforated cholecystitis is less likely.  BP:158/114 - 141/101  HR 90's, RR 20's currently   Patient was admitted to the hospital her pain was controlled.  She was placed on IV antibiotics.  She was taken the operating room the following a.m.  The gallbladder was quite thickened and distended and will require decompression with suction aspiration. Patient tolerated procedure well return to the floor in satisfactory condition.  She was maintained on IV antibiotics in the hospital and discharged home on 7 more days of oral antibiotics.  On the first  postoperative day she was tolerating soft diet, ambulating without difficulty, voiding.  Her port sites all look good and she was ready for discharge.  CBC Latest Ref Rng & Units 09/16/2019 09/15/2019 09/14/2019  WBC 4.0 - 10.5 K/uL 13.0(H) 15.9(H) 11.4(H)  Hemoglobin 12.0 - 15.0 g/dL 56.2 15.3(H) 15.5(H)  Hematocrit 36.0 - 46.0 % 36.6 44.8 44.8  Platelets 150 - 400 K/uL 186 236 278    CMP Latest Ref Rng & Units 09/16/2019 09/15/2019 09/14/2019  Glucose 70 - 99 mg/dL 563(S) 937(D) 428(J)  BUN 6 - 20 mg/dL 8 10 14   Creatinine 0.44 - 1.00 mg/dL 6.81 1.57  Sodium 135 - 145 mmol/L 136 136 137  Potassium 3.5 - 5.1 mmol/L 3.8 3.9 3.9  Chloride 98 - 111 mmol/L 104 102 102  CO2 22 - 32 mmol/L 23 21(L) 21(L)  Calcium 8.9 - 10.3 mg/dL 8.2(L) 8.6(L) 9.3  Total Protein 6.5 - 8.1 g/dL 2.62) 6.5 7.5  Total Bilirubin 0.3 - 1.2 mg/dL 0.3(T) 5.9(R) 1.1  Alkaline Phos 38 - 126 U/L 90 68 75  AST 15 - 41 U/L 168(H) 177(H) 352(H)  ALT 0 - 44 U/L 122(H) 95(H) 79(H)   Condition on discharge: Improved Pathology is still pending.   Disposition: Discharge disposition: 01-Home or Self Care        Allergies as of 09/16/2019      Reactions   Sulfa Drugs Cross Reactors Hives   Yaz [drospirenone-ethinyl Estradiol] Other (See Comments)   Headache      Medication List    TAKE these medications   acetaminophen 500 MG tablet Commonly known as: TYLENOL You  can take 1000 mg of Tylenol every 8 hours as needed for pain.  For the first 24-48 hours I would just take this around the clock.  You can alternate this with ibuprofen 600 mg every 6 hours.  You can buy Tylenol/acetaminophen over-the-counter at any drugstore.   amLODipine-benazepril 5-10 MG capsule Commonly known as: Lotrel Take 1 capsule by mouth daily.   ciprofloxacin 500 MG tablet Commonly known as: Cipro Take 1 tablet (500 mg total) by mouth 2 (two) times daily for 10 days.   ibuprofen 200 MG tablet Commonly known as: ADVIL You can  take 2 to 3 tablets every 6 hours as needed for pain.  You can alternate this with the plain Tylenol for maximal pain relief.  You can buy this over-the-counter at any drugstore.(You can also use the prescription ibuprofen you have at home in place of this.)   metroNIDAZOLE 500 MG tablet Commonly known as: Flagyl Take 1 tablet (500 mg total) by mouth 3 (three) times daily for 7 days.   omeprazole 20 MG tablet Commonly known as: PRILOSEC OTC Take 20 mg by mouth daily.   oxyCODONE 5 MG immediate release tablet Commonly known as: Oxy IR/ROXICODONE Take 1 tablet (5 mg total) by mouth every 6 (six) hours as needed for severe pain or breakthrough pain (Pain not relieved by Tylenol/ibuprofen).   PARAGARD INTRAUTERINE COPPER IU 1 Device by Intrauterine route.   sertraline 50 MG tablet Commonly known as: ZOLOFT Take 1 tablet (50 mg total) by mouth daily.      Follow-up Information    [facility] Follow up on 10/05/2019.   Specialty: General Surgery Why: Follow up appointment scheduled for 9:15 AM. A provider will call you during scheduled appointment time. Please send a photo of incisions,license, and insurance card to [email or web address] the day prior to appointment.   Contact information: [address] [place] [address] [phone]        Ria Bush, MD Follow up.   Specialty: Family Medicine Why: Call and let them know you have had surgery and follow-up for medical issues. Contact information: [place] [address] [phone]           Signed: Earnstine Regal 09/16/2019, 11:26 AM

## 2019-09-16 NOTE — Progress Notes (Signed)
Patient discharged to home. Verbalized understanding of all discharge instructions including incision care, discharge medications and follow up MD visits. 

## 2019-09-16 NOTE — Discharge Instructions (Signed)
Laparoscopic Cholecystectomy, Care After °This sheet gives you information about how to care for yourself after your procedure. Your health care provider may also give you more specific instructions. If you have problems or questions, contact your health care provider. °What can I expect after the procedure? °After the procedure, it is common to have: °· Pain at your incision sites. You will be given medicines to control this pain. °· Mild nausea or vomiting. °· Bloating and possible shoulder pain from the air-like gas that was used during the procedure. °Follow these instructions at home: °Incision care ° °· Follow instructions from your health care provider about how to take care of your incisions. Make sure you: °? Wash your hands with soap and water before you change your bandage (dressing). If soap and water are not available, use hand sanitizer. °? Change your dressing as told by your health care provider. °? Leave stitches (sutures), skin glue, or adhesive strips in place. These skin closures may need to be in place for 2 weeks or longer. If adhesive strip edges start to loosen and curl up, you may trim the loose edges. Do not remove adhesive strips completely unless your health care provider tells you to do that. °· Do not take baths, swim, or use a hot tub until your health care provider approves. Ask your health care provider if you can take showers. You may only be allowed to take sponge baths for bathing. °· Check your incision area every day for signs of infection. Check for: °? More redness, swelling, or pain. °? More fluid or blood. °? Warmth. °? Pus or a bad smell. °Activity °· Do not drive or use heavy machinery while taking prescription pain medicine. °· Do not lift anything that is heavier than 10 lb (4.5 kg) until your health care provider approves. °· Do not play contact sports until your health care provider approves. °· Do not drive for 24 hours if you were given a medicine to help you relax  (sedative). °· Rest as needed. Do not return to work or school until your health care provider approves. °General instructions °· Take over-the-counter and prescription medicines only as told by your health care provider. °· To prevent or treat constipation while you are taking prescription pain medicine, your health care provider may recommend that you: °? Drink enough fluid to keep your urine clear or pale yellow. °? Take over-the-counter or prescription medicines. °? Eat foods that are high in fiber, such as fresh fruits and vegetables, whole grains, and beans. °? Limit foods that are high in fat and processed sugars, such as fried and sweet foods. °Contact a health care provider if: °· You develop a rash. °· You have more redness, swelling, or pain around your incisions. °· You have more fluid or blood coming from your incisions. °· Your incisions feel warm to the touch. °· You have pus or a bad smell coming from your incisions. °· You have a fever. °· One or more of your incisions breaks open. °Get help right away if: °· You have trouble breathing. °· You have chest pain. °· You have increasing pain in your shoulders. °· You faint or feel dizzy when you stand. °· You have severe pain in your abdomen. °· You have nausea or vomiting that lasts for more than one day. °· You have leg pain. °This information is not intended to replace advice given to you by your health care provider. Make sure you discuss any questions you have   with your health care provider. Document Released: 09/23/2005 Document Revised: 09/05/2017 Document Reviewed: 03/11/2016 Elsevier Patient Education  2020 [employer].   [facility] ______[facility] LAPAROSCOPIC SURGERY: POST OP INSTRUCTIONS Always review your discharge instruction sheet given to you by the facility where your surgery was performed. IF YOU HAVE DISABILITY OR FAMILY LEAVE FORMS, YOU MUST BRING THEM TO THE OFFICE FOR PROCESSING.   DO NOT GIVE THEM TO YOUR  DOCTOR.  1. A prescription for pain medication may be given to you upon discharge.  Take your pain medication as prescribed, if needed.  If narcotic pain medicine is not needed, then you may take acetaminophen (Tylenol) or ibuprofen (Advil) as needed. 2. Take your usually prescribed medications unless otherwise directed. 3. If you need a refill on your pain medication, please contact your pharmacy.  They will contact our office to request authorization. Prescriptions will not be filled after 5pm or on week-ends. 4. You should follow a light diet the first few days after arrival home, such as soup and crackers, etc.  Be sure to include lots of fluids daily. 5. Most patients will experience some swelling and bruising in the area of the incisions.  Ice packs will help.  Swelling and bruising can take several days to resolve.  6. It is common to experience some constipation if taking pain medication after surgery.  Increasing fluid intake and taking a stool softener (such as Colace) will usually help or prevent this problem from occurring.  A mild laxative (Milk of Magnesia or Miralax) should be taken according to package instructions if there are no bowel movements after 48 hours. 7. Unless discharge instructions indicate otherwise, you may remove your bandages 24-48 hours after surgery, and you may shower at that time.  You may have steri-strips (small skin tapes) in place directly over the incision.  These strips should be left on the skin for 7-10 days.  If your surgeon used skin glue on the incision, you may shower in 24 hours.  The glue will flake off over the next 2-3 weeks.  Any sutures or staples will be removed at the office during your follow-up visit. 8. ACTIVITIES:  You may resume regular (light) daily activities beginning the next day--such as daily self-care, walking, climbing stairs--gradually increasing activities as tolerated.  You may have sexual intercourse when it is comfortable.  Refrain  from any heavy lifting or straining until approved by your doctor. a. You may drive when you are no longer taking prescription pain medication, you can comfortably wear a seatbelt, and you can safely maneuver your car and apply brakes. b. RETURN TO WORK:  __________________________________________________________ 9. You should see your doctor in the office for a follow-up appointment approximately 2-3 weeks after your surgery.  Make sure that you call for this appointment within a day or two after you arrive home to insure a convenient appointment time. 10. OTHER INSTRUCTIONS: __________________________________________________________________________________________________________________________ __________________________________________________________________________________________________________________________ WHEN TO CALL YOUR DOCTOR: 1. Fever over 101.0 2. Inability to urinate 3. Continued bleeding from incision. 4. Increased pain, redness, or drainage from the incision. 5. Increasing abdominal pain  The clinic staff is available to answer your questions during regular business hours.  Please dont hesitate to call and ask to speak to one of the nurses for clinical concerns.  If you have a medical emergency, go to the nearest emergency room or call 911.  A surgeon from [facility] is always on call at the hospital. [address], [employer]  [address] [place], [place]  [address] ? [address], [place], [place]   [address] [phone] ? [phone] ? FAX [phone] Web site: [email or web address]   Gallbladder Eating Plan If you have a gallbladder condition, you may have trouble digesting fats. Eating a low-fat diet can help reduce your symptoms, and may be helpful before and after having surgery to remove your gallbladder (cholecystectomy). Your health care provider may recommend that you work with a diet and nutrition specialist (dietitian) to help you reduce the  amount of fat in your diet. What are tips for following this plan? General guidelines  Limit your fat intake to less than 30% of your total daily calories. If you eat around 1,800 calories each day, this is less than 60 grams (g) of fat per day.  Fat is an important part of a healthy diet. Eating a low-fat diet can make it hard to maintain a healthy body weight. Ask your dietitian how much fat, calories, and other nutrients you need each day.  Eat small, frequent meals throughout the day instead of three large meals.  Drink at least 8-10 cups of fluid a day. Drink enough fluid to keep your urine clear or pale yellow.  Limit alcohol intake to no more than 1 drink a day for nonpregnant women and 2 drinks a day for men. One drink equals 12 oz of beer, 5 oz of wine, or 1 oz of hard liquor. Reading food labels  Check Nutrition Facts on food labels for the amount of fat per serving. Choose foods with less than 3 grams of fat per serving. Shopping  Choose nonfat and low-fat healthy foods. Look for the words nonfat, low fat, or fat free.  Avoid buying processed or prepackaged foods. Cooking  Cook using low-fat methods, such as baking, broiling, grilling, or boiling.  Cook with small amounts of healthy fats, such as olive oil, grapeseed oil, canola oil, or sunflower oil. What foods are recommended?   All fresh, frozen, or canned fruits and vegetables.  Whole grains.  Low-fat or non-fat (skim) milk and yogurt.  Lean meat, skinless poultry, fish, eggs, and beans.  Low-fat protein supplement powders or drinks.  Spices and herbs. What foods are not recommended?  High-fat foods. These include baked goods, fast food, fatty cuts of meat, ice cream, french toast, sweet rolls, pizza, cheese bread, foods covered with butter, creamy sauces, or cheese.  Fried foods. These include french fries, tempura, battered fish, breaded chicken, fried breads, and sweets.  Foods with strong  odors.  Foods that cause bloating and gas. Summary  A low-fat diet can be helpful if you have a gallbladder condition, or before and after gallbladder surgery.  Limit your fat intake to less than 30% of your total daily calories. This is about 60 g of fat if you eat 1,800 calories each day.  Eat small, frequent meals throughout the day instead of three large meals. This information is not intended to replace advice given to you by your health care provider. Make sure you discuss any questions you have with your health care provider. Document Released: 09/28/2013 Document Revised: 01/14/2019 Document Reviewed: 10/31/2016 Elsevier Patient Education  2020 [employer].

## 2019-09-18 ENCOUNTER — Encounter: Payer: Self-pay | Admitting: Family Medicine

## 2019-11-11 ENCOUNTER — Telehealth: Payer: Managed Care, Other (non HMO) | Admitting: Physician Assistant

## 2019-11-11 DIAGNOSIS — B9689 Other specified bacterial agents as the cause of diseases classified elsewhere: Secondary | ICD-10-CM

## 2019-11-11 DIAGNOSIS — J329 Chronic sinusitis, unspecified: Secondary | ICD-10-CM | POA: Diagnosis not present

## 2019-11-11 MED ORDER — AMOXICILLIN-POT CLAVULANATE 875-125 MG PO TABS: 1.0000 | ORAL_TABLET | Freq: Two times a day (BID) | ORAL | 0 refills | Status: DC

## 2019-11-11 NOTE — Progress Notes (Signed)
We are sorry that you are not feeling well.  Here is how we plan to help!  Based on what you have shared with me it looks like you have sinusitis.  Sinusitis is inflammation and infection in the sinus cavities of the head.  Based on your presentation I believe you most likely have Acute Bacterial Sinusitis.  This is an infection caused by bacteria and is treated with antibiotics. I have prescribed Augmentin 875mg/125mg one tablet twice daily with food, for 7 days. You may use an oral decongestant such as Mucinex D or if you have glaucoma or high blood pressure use plain Mucinex. Saline nasal spray help and can safely be used as often as needed for congestion.  If you develop worsening sinus pain, fever or notice severe headache and vision changes, or if symptoms are not better after completion of antibiotic, please schedule an appointment with a health care provider.    Sinus infections are not as easily transmitted as other respiratory infection, however we still recommend that you avoid close contact with loved ones, especially the very young and elderly.  Remember to wash your hands thoroughly throughout the day as this is the number one way to prevent the spread of infection!  Home Care:  Only take medications as instructed by your medical team.  Complete the entire course of an antibiotic.  Do not take these medications with alcohol.  A steam or ultrasonic humidifier can help congestion.  You can place a towel over your head and breathe in the steam from hot water coming from a faucet.  Avoid close contacts especially the very young and the elderly.  Cover your mouth when you cough or sneeze.  Always remember to wash your hands.  Get Help Right Away If:  You develop worsening fever or sinus pain.  You develop a severe head ache or visual changes.  Your symptoms persist after you have completed your treatment plan.  Make sure you  Understand these instructions.  Will watch your  condition.  Will get help right away if you are not doing well or get worse.  Your e-visit answers were reviewed by a board certified advanced clinical practitioner to complete your personal care plan.  Depending on the condition, your plan could have included both over the counter or prescription medications.  If there is a problem please reply  once you have received a response from your provider.  Your safety is important to us.  If you have drug allergies check your prescription carefully.    You can use MyChart to ask questions about today's visit, request a non-urgent call back, or ask for a work or school excuse for 24 hours related to this e-Visit. If it has been greater than 24 hours you will need to follow up with your provider, or enter a new e-Visit to address those concerns.  You will get an e-mail in the next two days asking about your experience.  I hope that your e-visit has been valuable and will speed your recovery. Thank you for using e-visits.  Nel Stoneking PA-C  Approximately 5 minutes was spent documenting and reviewing patient's chart.    

## 2019-11-12 ENCOUNTER — Other Ambulatory Visit: Payer: Managed Care, Other (non HMO)

## 2019-12-04 ENCOUNTER — Ambulatory Visit: Payer: Managed Care, Other (non HMO) | Attending: Internal Medicine

## 2019-12-04 DIAGNOSIS — Z23 Encounter for immunization: Secondary | ICD-10-CM | POA: Insufficient documentation

## 2019-12-04 NOTE — Progress Notes (Signed)
   Covid-19 Vaccination Clinic  Name:  Jennifer Mays    MRN: [id number] DOB: 1991-05-28  12/04/2019  Jennifer Mays was observed post Covid-19 immunization for 15 minutes without incidence. She was provided with Vaccine Information Sheet and instruction to access the V-Safe system.   Jennifer Mays was instructed to call 911 with any severe reactions post vaccine: Marland Kitchen Difficulty breathing  . Swelling of your face and throat  . A fast heartbeat  . A bad rash all over your body  . Dizziness and weakness    Immunizations Administered    Name Date Dose VIS Date Route   Pfizer COVID-19 Vaccine 12/04/2019 11:05 AM 0.3 mL 09/17/2019 Intramuscular   Manufacturer: [employer], [employer]   Lot: [id number]   NDC: [address]-8

## 2019-12-25 ENCOUNTER — Ambulatory Visit: Payer: Managed Care, Other (non HMO) | Attending: Internal Medicine

## 2019-12-25 DIAGNOSIS — Z23 Encounter for immunization: Secondary | ICD-10-CM

## 2019-12-25 NOTE — Progress Notes (Signed)
   Covid-19 Vaccination Clinic  Name:  SEKAI NAYAK    MRN: [id number] DOB: November 03, 1990  12/25/2019  Ms. Burnside was observed post Covid-19 immunization for 15 minutes without incident. She was provided with Vaccine Information Sheet and instruction to access the V-Safe system.   Ms. Vannatter was instructed to call 911 with any severe reactions post vaccine: Marland Kitchen Difficulty breathing  . Swelling of face and throat  . A fast heartbeat  . A bad rash all over body  . Dizziness and weakness   Immunizations Administered    Name Date Dose VIS Date Route   Pfizer COVID-19 Vaccine 12/25/2019 12:08 PM 0.3 mL 09/17/2019 Intramuscular   Manufacturer: [employer], [employer]   Lot: [id number]   NDC: [address]-4

## 2019-12-29 ENCOUNTER — Ambulatory Visit: Payer: Managed Care, Other (non HMO)

## 2020-03-29 NOTE — Progress Notes (Deleted)
PCP:  Ria Bush, MD   No chief complaint on file.    HPI:      Ms. Jennifer Mays is a 29 y.o. [id number] whose LMP was No LMP recorded. (Menstrual status: IUD)., presents today for her annual examination.  Her menses are regular every 28-30 days, lasting {number:22536} days.  Dysmenorrhea {dysmen:716}. She {does:18564} have intermenstrual bleeding.  Sex activity: single partner, contraception - IUD. Paragard placed 10/22/15 Last Pap: 05/08/17 Results were: no abnormalities ; s/p LEEP 2007 Hx of STDs: HPV  There is no FH of breast cancer. There is no FH of ovarian cancer. The patient {does:18564} do self-breast exams.  Tobacco use: {tob:20664} Alcohol use: {Alcohol:11675} No drug use.  Exercise: {exercise:31265}  She {does:18564} get adequate calcium and Vitamin D in her diet. Labs with PCP  Past Medical History:  Diagnosis Date  . Abnormal Pap smear and cervical HPV (human papillomavirus) 2007   s/p LEEP for cervical dysplasia  . Anxiety   . Generalized headaches   . History of chicken pox   . History of chlamydia 02/2012   treated  . HTN (hypertension) 07/31/2011  . Migraines   . Varicose vein 03/2012   saw [facility], rec compression stocking    Past Surgical History:  Procedure Laterality Date  . CHOLECYSTECTOMY N/A 09/15/2019   LAPAROSCOPIC CHOLECYSTECTOMY WITH INTRAOPERATIVE CHOLANGIOGRAM;  Donnie Mesa, MD;  Location: [place];  Service: General;  Laterality: N/A; at [facility]  . INTRAUTERINE DEVICE INSERTION  10/22/2015  . LEEP  2007  . TONSILLECTOMY AND ADENOIDECTOMY  2002    Family History  Problem Relation Age of Onset  . Hypertension Father   . Asthma Father   . Coronary artery disease Paternal Grandmother 74       MI  . Hypertension Paternal Grandmother   . Diabetes Paternal Grandfather   . Stroke Paternal Grandfather   . Hypertension Paternal Grandfather   . Alcohol abuse Maternal Grandfather   . Asthma Sister   . Parkinsonism Maternal Aunt   .  Hypertension Paternal Aunt   . Hypertension Paternal Uncle   . Cancer Neg Hx     Social History   Socioeconomic History  . Marital status: Married    Spouse name: Not on file  . Number of children: 2  . Years of education: Not on file  . Highest education level: Not on file  Occupational History  . Occupation: Homemaker  Tobacco Use  . Smoking status: Former Smoker    Quit date: 10/07/2012    Years since quitting: 7.4  . Smokeless tobacco: Never Used  Vaping Use  . Vaping Use: Never used  Substance and Sexual Activity  . Alcohol use: Yes    Alcohol/week: 2.0 standard drinks    Types: 2 Glasses of wine per week    Comment: drinks on weekends  . Drug use: No  . Sexual activity: Yes    Partners: Male    Birth control/protection: I.U.D.    Comment: Paraguard  Other Topics Concern  . Not on file  Social History Narrative   Caffeine: 1-2 cups coffee/am   Lives with husband, husband's friends, 1 dog   Studying psychology in [place]   Activity: yoga/pilates, no other reg exercise   Diet: good fruits/vegetables, avoids fast foods/processed foods, good amt water   Social Determinants of Health   Financial Resource Strain:   . Difficulty of Paying Living Expenses:   Food Insecurity:   . Worried About Charity fundraiser  in the Last Year:   . Ran Out of Food in the Last Year:   Transportation Needs:   . Freight forwarder (Medical):   Marland Kitchen Lack of Transportation (Non-Medical):   Physical Activity:   . Days of Exercise per Week:   . Minutes of Exercise per Session:   Stress:   . Feeling of Stress :   Social Connections:   . Frequency of Communication with Friends and Family:   . Frequency of Social Gatherings with Friends and Family:   . Attends Religious Services:   . Active Member of Clubs or Organizations:   . Attends Banker Meetings:   Marland Kitchen Marital Status:   Intimate Partner Violence:   . Fear of Current or Ex-Partner:   . Emotionally Abused:     Marland Kitchen Physically Abused:   . Sexually Abused:      Current Outpatient Medications:  .  acetaminophen (TYLENOL) 500 MG tablet, You can take 1000 mg of Tylenol every 8 hours as needed for pain.  For the first 24-48 hours I would just take this around the clock.  You can alternate this with ibuprofen 600 mg every 6 hours.  You can buy Tylenol/acetaminophen over-the-counter at any drugstore., Disp: 30 tablet, Rfl: 0 .  amLODipine-benazepril (LOTREL) 5-10 MG capsule, Take 1 capsule by mouth daily., Disp: 90 capsule, Rfl: 3 .  amoxicillin-clavulanate (AUGMENTIN) 875-125 MG tablet, Take 1 tablet by mouth 2 (two) times daily., Disp: 14 tablet, Rfl: 0 .  ibuprofen (ADVIL) 200 MG tablet, You can take 2 to 3 tablets every 6 hours as needed for pain.  You can alternate this with the plain Tylenol for maximal pain relief.  You can buy this over-the-counter at any drugstore.(You can also use the prescription ibuprofen you have at home in place of this.), Disp: , Rfl:  .  omeprazole (PRILOSEC OTC) 20 MG tablet, Take 20 mg by mouth daily., Disp: , Rfl:  .  oxyCODONE (OXY IR/ROXICODONE) 5 MG immediate release tablet, Take 1 tablet (5 mg total) by mouth every 6 (six) hours as needed for severe pain or breakthrough pain (Pain not relieved by Tylenol/ibuprofen)., Disp: 15 tablet, Rfl: 0 .  PARAGARD INTRAUTERINE COPPER IU, 1 Device by Intrauterine route. , Disp: , Rfl:  .  sertraline (ZOLOFT) 50 MG tablet, Take 1 tablet (50 mg total) by mouth daily., Disp: 90 tablet, Rfl: 3     ROS:  Review of Systems BREAST: No symptoms   Objective: There were no vitals taken for this visit.   OBGyn Exam  Results: No results found for this or any previous visit (from the past 24 hour(s)).  Assessment/Plan: No diagnosis found.  No orders of the defined types were placed in this encounter.            GYN counsel {counseling:16159}     F/U  No follow-ups on file.  Alexios Keown B. Anamae Rochelle, PA-C 03/29/2020 4:26 PM

## 2020-03-30 ENCOUNTER — Ambulatory Visit: Payer: Managed Care, Other (non HMO) | Admitting: Obstetrics and Gynecology

## 2020-06-17 ENCOUNTER — Other Ambulatory Visit: Payer: Self-pay | Admitting: Family Medicine

## 2020-07-13 ENCOUNTER — Telehealth: Payer: Self-pay | Admitting: Family Medicine

## 2020-07-13 ENCOUNTER — Other Ambulatory Visit: Payer: Self-pay | Admitting: Family Medicine

## 2020-07-13 NOTE — Telephone Encounter (Signed)
E-scribed refill.  Plz schedule cpe and lab visits.  

## 2020-07-18 NOTE — Telephone Encounter (Signed)
Called patient to set up appointment , patient did not answer left a VM.

## 2020-07-18 NOTE — Telephone Encounter (Signed)
Scheduled labs 09/05/2020 and cpe 09/12/2020

## 2020-07-18 NOTE — Telephone Encounter (Signed)
Noted  

## 2020-08-13 ENCOUNTER — Other Ambulatory Visit: Payer: Self-pay | Admitting: Family Medicine

## 2020-09-03 ENCOUNTER — Other Ambulatory Visit: Payer: Self-pay | Admitting: Family Medicine

## 2020-09-03 DIAGNOSIS — E785 Hyperlipidemia, unspecified: Secondary | ICD-10-CM

## 2020-09-05 ENCOUNTER — Other Ambulatory Visit (INDEPENDENT_AMBULATORY_CARE_PROVIDER_SITE_OTHER): Payer: Managed Care, Other (non HMO)

## 2020-09-05 ENCOUNTER — Other Ambulatory Visit: Payer: Self-pay

## 2020-09-05 DIAGNOSIS — E785 Hyperlipidemia, unspecified: Secondary | ICD-10-CM | POA: Diagnosis not present

## 2020-09-05 LAB — COMPREHENSIVE METABOLIC PANEL
ALT: 68 U/L — ABNORMAL HIGH (ref 0–35)
AST: 66 U/L — ABNORMAL HIGH (ref 0–37)
Albumin: 4.4 g/dL (ref 3.5–5.2)
Alkaline Phosphatase: 88 U/L (ref 39–117)
BUN: 12 mg/dL (ref 6–23)
CO2: 27 mEq/L (ref 19–32)
Calcium: 9.2 mg/dL (ref 8.4–10.5)
Chloride: 101 mEq/L (ref 96–112)
Creatinine, Ser: 0.8 mg/dL (ref 0.40–1.20)
GFR: 98.38 mL/min (ref >60.00)
Glucose, Bld: 89 mg/dL (ref 70–99)
Potassium: 4.1 mEq/L (ref 3.5–5.1)
Sodium: 136 mEq/L (ref 135–145)
Total Bilirubin: 1.1 mg/dL (ref 0.2–1.2)
Total Protein: 7.3 g/dL (ref 6.0–8.3)

## 2020-09-05 LAB — LIPID PANEL
Cholesterol: 243 mg/dL — ABNORMAL HIGH (ref 0–200)
HDL: 71.3 mg/dL (ref >39.00)
Total CHOL/HDL Ratio: 3
Triglycerides: 431 mg/dL — ABNORMAL HIGH (ref 0.0–149.0)

## 2020-09-05 LAB — LDL CHOLESTEROL, DIRECT: Direct LDL: 117 mg/dL

## 2020-09-12 ENCOUNTER — Other Ambulatory Visit: Payer: Self-pay

## 2020-09-12 ENCOUNTER — Encounter: Payer: Self-pay | Admitting: Family Medicine

## 2020-09-12 ENCOUNTER — Ambulatory Visit (INDEPENDENT_AMBULATORY_CARE_PROVIDER_SITE_OTHER): Payer: Managed Care, Other (non HMO) | Admitting: Family Medicine

## 2020-09-12 VITALS — BP 142/102 | HR 89 | Temp 98.0°F | Ht 69.5 in | Wt 223.0 lb

## 2020-09-12 DIAGNOSIS — I872 Venous insufficiency (chronic) (peripheral): Secondary | ICD-10-CM

## 2020-09-12 DIAGNOSIS — F411 Generalized anxiety disorder: Secondary | ICD-10-CM | POA: Diagnosis not present

## 2020-09-12 DIAGNOSIS — E669 Obesity, unspecified: Secondary | ICD-10-CM

## 2020-09-12 DIAGNOSIS — E66811 Obesity, class 1: Secondary | ICD-10-CM

## 2020-09-12 DIAGNOSIS — E781 Pure hyperglyceridemia: Secondary | ICD-10-CM

## 2020-09-12 DIAGNOSIS — R059 Cough, unspecified: Secondary | ICD-10-CM | POA: Diagnosis not present

## 2020-09-12 DIAGNOSIS — I10 Essential (primary) hypertension: Secondary | ICD-10-CM

## 2020-09-12 DIAGNOSIS — Z0001 Encounter for general adult medical examination with abnormal findings: Secondary | ICD-10-CM

## 2020-09-12 MED ORDER — AMLODIPINE BESY-BENAZEPRIL HCL 5-20 MG PO CAPS: 1.0000 | ORAL_CAPSULE | Freq: Every day | ORAL | 3 refills | Status: DC

## 2020-09-12 MED ORDER — SERTRALINE HCL 50 MG PO TABS: 50.0000 mg | ORAL_TABLET | Freq: Every day | ORAL | 3 refills | Status: DC

## 2020-09-12 NOTE — Progress Notes (Signed)
Patient ID: Jennifer Mays, female    DOB: 08/13/1991, 29 y.o.   MRN: [id number]  This visit was conducted in person.  BP (!) 142/102 (BP Location: Right Arm, Patient Position: Sitting, Cuff Size: Large)   Pulse 89   Temp 98 F (36.7 C) (Temporal)   Ht 5' 9.5" (1.765 m)   Wt 223 lb (101.2 kg)   LMP 09/03/2020   SpO2 100%   BMI 32.46 kg/m    CC: CPE Subjective:   HPI: Jennifer Mays is a 29 y.o. female presenting on 09/12/2020 for Annual Exam and Cough (C/o cough.  Started about 3 days ago.  Denies any other sxs.  Children have been sick since Thanksgiving weekend, COVID neg. )   HTN - compliant with lotrel 5/10mg  daily. Even when feeling well, BP ranging 140/90s.   Children sick at home - she started developing cough 3 days ago without other respiratory symptoms including fever, dyspnea, ST, fatigue or malaise, abd pain, nausea, diarrhea, loss of taste or smell, HA. Describes productive cough of clear mucous. +PNdrainage with sinus pressure, chest > head congestion. Children tested negative for COVID. No h/o pneumonia, no asthma hx. Treating with dayquil - likely causing BP elevation.   Preventative: Well woman exam - [facility] - will call for appt. Multiple recent normal. Flu - yearly COVID vaccine Pfizer 11/2019, 12/2019, 07/2020 Tdap 2016 Seat belt use discussed.  Sunscreen use discussed - no changing moles on skin Alcohol - 2-3 6oz glasses wine/day  Smoking - none  Dentist q6 mo Eye exam yearly  Caffeine: 1 16 oz soda/day Lives with husband, 2 children, 1 dog Occ: [employer]  Edu: [employer] early education Activity: walking, has treadmill  Diet: good fruits/vegetables, avoids fast foods/processed foods, good amt water     Relevant past medical, surgical, family and social history reviewed and updated as indicated. Interim medical history since our last visit reviewed. Allergies and medications reviewed and updated. Outpatient Medications Prior to Visit   Medication Sig Dispense Refill  . omeprazole (PRILOSEC OTC) 20 MG tablet Take 20 mg by mouth daily.    Marland Kitchen PARAGARD INTRAUTERINE COPPER IU 1 Device by Intrauterine route.     Marland Kitchen amLODipine-benazepril (LOTREL) 5-10 MG capsule TAKE 1 CAPSULE BY MOUTH EVERY DAY 30 capsule 0  . sertraline (ZOLOFT) 50 MG tablet TAKE 1 TABLET BY MOUTH EVERY DAY 90 tablet 1  . acetaminophen (TYLENOL) 500 MG tablet You can take 1000 mg of Tylenol every 8 hours as needed for pain.  For the first 24-48 hours I would just take this around the clock.  You can alternate this with ibuprofen 600 mg every 6 hours.  You can buy Tylenol/acetaminophen over-the-counter at any drugstore. 30 tablet 0  . amoxicillin-clavulanate (AUGMENTIN) 875-125 MG tablet Take 1 tablet by mouth 2 (two) times daily. 14 tablet 0  . ibuprofen (ADVIL) 200 MG tablet You can take 2 to 3 tablets every 6 hours as needed for pain.  You can alternate this with the plain Tylenol for maximal pain relief.  You can buy this over-the-counter at any drugstore.(You can also use the prescription ibuprofen you have at home in place of this.)    . oxyCODONE (OXY IR/ROXICODONE) 5 MG immediate release tablet Take 1 tablet (5 mg total) by mouth every 6 (six) hours as needed for severe pain or breakthrough pain (Pain not relieved by Tylenol/ibuprofen). 15 tablet 0   No facility-administered medications prior to visit.  Per HPI unless specifically indicated in ROS section below Review of Systems  Constitutional: Negative for activity change, appetite change, chills, fatigue, fever and unexpected weight change.  HENT: Negative for hearing loss.   Eyes: Negative for visual disturbance.  Respiratory: Positive for cough. Negative for chest tightness, shortness of breath and wheezing.   Cardiovascular: Negative for chest pain, palpitations and leg swelling.  Gastrointestinal: Negative for abdominal distention, abdominal pain, blood in stool, constipation, diarrhea, nausea and  vomiting.  Genitourinary: Negative for difficulty urinating and hematuria.  Musculoskeletal: Negative for arthralgias, myalgias and neck pain.  Skin: Negative for rash.  Neurological: Negative for dizziness, seizures, syncope and headaches.  Hematological: Negative for adenopathy. Does not bruise/bleed easily.  Psychiatric/Behavioral: Negative for dysphoric mood. The patient is not nervous/anxious.    Objective:  BP (!) 142/102 (BP Location: Right Arm, Patient Position: Sitting, Cuff Size: Large)   Pulse 89   Temp 98 F (36.7 C) (Temporal)   Ht 5' 9.5" (1.765 m)   Wt 223 lb (101.2 kg)   LMP 09/03/2020   SpO2 100%   BMI 32.46 kg/m   Wt Readings from Last 3 Encounters:  09/12/20 223 lb (101.2 kg)  05/14/19 237 lb (107.5 kg)  04/22/19 231 lb 2 oz (104.8 kg)      Physical Exam Vitals and nursing note reviewed.  Constitutional:      General: She is not in acute distress.    Appearance: Normal appearance. She is well-developed. She is not ill-appearing.  HENT:     Head: Normocephalic and atraumatic.     Right Ear: Hearing, tympanic membrane, ear canal and external ear normal.     Left Ear: Hearing, tympanic membrane, ear canal and external ear normal.  Eyes:     General: No scleral icterus.    Extraocular Movements: Extraocular movements intact.     Conjunctiva/sclera: Conjunctivae normal.     Pupils: Pupils are equal, round, and reactive to light.  Neck:     Thyroid: No thyroid mass or thyromegaly.  Cardiovascular:     Rate and Rhythm: Normal rate and regular rhythm.     Pulses: Normal pulses.          Radial pulses are 2+ on the right side and 2+ on the left side.     Heart sounds: Normal heart sounds. No murmur heard.   Pulmonary:     Effort: Pulmonary effort is normal. No respiratory distress.     Breath sounds: Normal breath sounds. No wheezing, rhonchi or rales.     Comments: Lungs clear Abdominal:     General: Abdomen is flat. Bowel sounds are normal. There is no  distension.     Palpations: Abdomen is soft. There is no mass.     Tenderness: There is no abdominal tenderness. There is no guarding or rebound.     Hernia: No hernia is present.  Musculoskeletal:        General: Normal range of motion.     Cervical back: Normal range of motion and neck supple.     Right lower leg: No edema.     Left lower leg: No edema.  Lymphadenopathy:     Cervical: No cervical adenopathy.  Skin:    General: Skin is warm and dry.     Findings: No rash.  Neurological:     General: No focal deficit present.     Mental Status: She is alert and oriented to person, place, and time.     Comments: CN  grossly intact, station and gait intact  Psychiatric:        Mood and Affect: Mood normal.        Behavior: Behavior normal.        Thought Content: Thought content normal.        Judgment: Judgment normal.       Results for orders placed or performed in visit on 09/05/20  Lipid panel  Result Value Ref Range   Cholesterol 243 (H) 0 - 200 mg/dL   Triglycerides (H) 0 - 149 mg/dL    161.0 Triglyceride is over 400; calculations on Lipids are invalid.   HDL 71.30 >39.00 mg/dL   Total CHOL/HDL Ratio 3   Comprehensive metabolic panel  Result Value Ref Range   Sodium 136 135 - 145 mEq/L   Potassium 4.1 3.5 - 5.1 mEq/L   Chloride 101 96 - 112 mEq/L   CO2 27 19 - 32 mEq/L   Glucose, Bld 89 70 - 99 mg/dL   BUN 12 6 - 23 mg/dL   Creatinine, Ser 9.60 0.40 - 1.20 mg/dL   Total Bilirubin 1.1 0.2 - 1.2 mg/dL   Alkaline Phosphatase 88 39 - 117 U/L   AST 66 (H) 0 - 37 U/L   ALT 68 (H) 0 - 35 U/L   Total Protein 7.3 6.0 - 8.3 g/dL   Albumin 4.4 3.5 - 5.2 g/dL   GFR 45.40 >98.11 mL/min   Calcium 9.2 8.4 - 10.5 mg/dL  LDL cholesterol, direct  Result Value Ref Range   Direct LDL 117.0 mg/dL   Depression screen [facility] 2/9 09/12/2020 12/11/2018  Decreased Interest 0 0  Down, Depressed, Hopeless 0 1  PHQ - 2 Score 0 1  Altered sleeping 1 3  Tired, decreased energy 0 1  Change  in appetite 1 1  Feeling bad or failure about yourself  0 1  Trouble concentrating 0 0  Moving slowly or fidgety/restless 0 0  Suicidal thoughts 0 0  PHQ-9 Score 2 7    GAD 7 : Generalized Anxiety Score 09/12/2020 12/11/2018  Nervous, Anxious, on Edge 0 3  Control/stop worrying 0 3  Worry too much - different things 1 3  Trouble relaxing 0 3  Restless 0 3  Easily annoyed or irritable 0 2  Afraid - awful might happen 0 3  Total GAD 7 Score 1 20   Assessment & Plan:  This visit occurred during the SARS-CoV-2 public health emergency.  Safety protocols were in place, including screening questions prior to the visit, additional usage of staff PPE, and extensive cleaning of exam room while observing appropriate contact time as indicated for disinfecting solutions.   Problem List Items Addressed This Visit    Obesity, Class I, BMI 30.0-34.9 (see actual BMI)    Congratulated on weight loss to date.  Encouraged healthy diet and lifestyle choices to affect sustainable weight loss.       Hyperlipidemia    Marked triglyceride elevation. Discussed relation to alcohol use. Encouraged cutting down. Reviewed other dietary measures to improve triglycerides. RTC 6 mo lab visit only to recheck this.  The ASCVD Risk score Denman George DC Jr., et al., 2013) failed to calculate for the following reasons:   The 2013 ASCVD risk score is only valid for ages 55 to 60       Relevant Medications   amLODipine-benazepril (LOTREL) 5-20 MG capsule   HTN (hypertension)    BP elevated today in setting of dayquil use. However, BP remains too  high when feeling well (140/90s). Will increase lotrel to 5/20mg  daily. New dose sent to pharmacy.       Relevant Medications   amLODipine-benazepril (LOTREL) 5-20 MG capsule   GAD (generalized anxiety disorder)    Stable period on sertraline 50mg  - desires to continue this.       Relevant Medications   sertraline (ZOLOFT) 50 MG tablet   Encounter for general adult medical  examination with abnormal findings - Primary    Preventative protocols reviewed and updated unless pt declined. Discussed healthy diet and lifestyle.       Cough    Cough x 3 days with children ill at home but they have tested negative for COVID. She is fortunately fully immunized having completed Pfizer booster 07/2020 so low likelihood of this being COVID - but with line of work will send swab today.       Relevant Orders   Novel Coronavirus, NAA ([facility])   Chronic venous insufficiency   Relevant Medications   amLODipine-benazepril (LOTREL) 5-20 MG capsule       Meds ordered this encounter  Medications  . sertraline (ZOLOFT) 50 MG tablet    Sig: Take 1 tablet (50 mg total) by mouth daily.    Dispense:  90 tablet    Refill:  3  . amLODipine-benazepril (LOTREL) 5-20 MG capsule    Sig: Take 1 capsule by mouth daily.    Dispense:  90 capsule    Refill:  3    Note new dose   Orders Placed This Encounter  Procedures  . Novel Coronavirus, NAA ([facility])    Order Specific Question:   Is this test for diagnosis or screening    Answer:   Diagnosis of ill patient    Order Specific Question:   Symptomatic for COVID-19 as defined by CDC    Answer:   Yes    Order Specific Question:   Date of Symptom Onset    Answer:   09/09/2020    Order Specific Question:   Hospitalized for COVID-19    Answer:   No    Order Specific Question:   Admitted to ICU for COVID-19    Answer:   No    Order Specific Question:   Previously tested for COVID-19    Answer:   Yes    Order Specific Question:   Resident in a congregate (group) care setting    Answer:   No    Order Specific Question:   Is the patient student?    Answer:   No    Order Specific Question:   Employed in healthcare setting    Answer:   No    Order Specific Question:   Pregnant    Answer:   No    Order Specific Question:   Has patient completed COVID vaccination(s) (2 doses of Pfizer/Moderna 1 dose of 14/01/2020)    Answer:    Yes    Patient instructions: Triglycerides and liver function are remaining elevated Decrease alcohol consumption, return in 6 months for lab visit only (fasting) to recheck these levels  Return 1 year for next physical.  COVID swab today.  Good to see you today  Follow up plan: Return in about 1 year (around 09/12/2021) for annual exam, prior fasting for blood work.  14/04/2021, MD

## 2020-09-12 NOTE — Patient Instructions (Addendum)
Triglycerides and liver function are remaining elevated Decrease alcohol consumption, return in 6 months for lab visit only (fasting) to recheck these levels  Return 1 year for next physical.  COVID swab today.  Good to see you today  Health Maintenance, Female Adopting a healthy lifestyle and getting preventive care are important in promoting health and wellness. Ask your health care provider about:  The right schedule for you to have regular tests and exams.  Things you can do on your own to prevent diseases and keep yourself healthy. What should I know about diet, weight, and exercise? Eat a healthy diet   Eat a diet that includes plenty of vegetables, fruits, low-fat dairy products, and lean protein.  Do not eat a lot of foods that are high in solid fats, added sugars, or sodium. Maintain a healthy weight Body mass index (BMI) is used to identify weight problems. It estimates body fat based on height and weight. Your health care provider can help determine your BMI and help you achieve or maintain a healthy weight. Get regular exercise Get regular exercise. This is one of the most important things you can do for your health. Most adults should:  Exercise for at least 150 minutes each week. The exercise should increase your heart rate and make you sweat (moderate-intensity exercise).  Do strengthening exercises at least twice a week. This is in addition to the moderate-intensity exercise.  Spend less time sitting. Even light physical activity can be beneficial. Watch cholesterol and blood lipids Have your blood tested for lipids and cholesterol at 31 years of age, then have this test every 5 years. Have your cholesterol levels checked more often if:  Your lipid or cholesterol levels are high.  You are older than 31 years of age.  You are at high risk for heart disease. What should I know about cancer screening? Depending on your health history and family history, you may  need to have cancer screening at various ages. This may include screening for:  Breast cancer.  Cervical cancer.  Colorectal cancer.  Skin cancer.  Lung cancer. What should I know about heart disease, diabetes, and high blood pressure? Blood pressure and heart disease  High blood pressure causes heart disease and increases the risk of stroke. This is more likely to develop in people who have high blood pressure readings, are of African descent, or are overweight.  Have your blood pressure checked: ? Every 3-5 years if you are 40-52 years of age. ? Every year if you are 90 years old or older. Diabetes Have regular diabetes screenings. This checks your fasting blood sugar level. Have the screening done:  Once every three years after age 6 if you are at a normal weight and have a low risk for diabetes.  More often and at a younger age if you are overweight or have a high risk for diabetes. What should I know about preventing infection? Hepatitis B If you have a higher risk for hepatitis B, you should be screened for this virus. Talk with your health care provider to find out if you are at risk for hepatitis B infection. Hepatitis C Testing is recommended for:  Everyone born from [birth year] through 1967.  Anyone with known risk factors for hepatitis C. Sexually transmitted infections (STIs)  Get screened for STIs, including gonorrhea and chlamydia, if: ? You are sexually active and are younger than 31 years of age. ? You are older than 31 years of age and your health  care provider tells you that you are at risk for this type of infection. ? Your sexual activity has changed since you were last screened, and you are at increased risk for chlamydia or gonorrhea. Ask your health care provider if you are at risk.  Ask your health care provider about whether you are at high risk for HIV. Your health care provider may recommend a prescription medicine to help prevent HIV infection. If you  choose to take medicine to prevent HIV, you should first get tested for HIV. You should then be tested every 3 months for as long as you are taking the medicine. Pregnancy  If you are about to stop having your period (premenopausal) and you may become pregnant, seek counseling before you get pregnant.  Take 400 to 800 micrograms (mcg) of folic acid every day if you become pregnant.  Ask for birth control (contraception) if you want to prevent pregnancy. Osteoporosis and menopause Osteoporosis is a disease in which the bones lose minerals and strength with aging. This can result in bone fractures. If you are 15 years old or older, or if you are at risk for osteoporosis and fractures, ask your health care provider if you should:  Be screened for bone loss.  Take a calcium or vitamin D supplement to lower your risk of fractures.  Be given hormone replacement therapy (HRT) to treat symptoms of menopause. Follow these instructions at home: Lifestyle  Do not use any products that contain nicotine or tobacco, such as cigarettes, e-cigarettes, and chewing tobacco. If you need help quitting, ask your health care provider.  Do not use street drugs.  Do not share needles.  Ask your health care provider for help if you need support or information about quitting drugs. Alcohol use  Do not drink alcohol if: ? Your health care provider tells you not to drink. ? You are pregnant, may be pregnant, or are planning to become pregnant.  If you drink alcohol: ? Limit how much you use to 0-1 drink a day. ? Limit intake if you are breastfeeding.  Be aware of how much alcohol is in your drink. In the [place] one drink equals one 12 oz bottle of beer (355 mL), one 5 oz glass of wine (148 mL), or one 1 oz glass of hard liquor (44 mL). General instructions  Schedule regular health, dental, and eye exams.  Stay current with your vaccines.  Tell your health care provider if: ? You often feel  depressed. ? You have ever been abused or do not feel safe at home. Summary  Adopting a healthy lifestyle and getting preventive care are important in promoting health and wellness.  Follow your health care provider's instructions about healthy diet, exercising, and getting tested or screened for diseases.  Follow your health care provider's instructions on monitoring your cholesterol and blood pressure. This information is not intended to replace advice given to you by your health care provider. Make sure you discuss any questions you have with your health care provider. Document Revised: 09/16/2018 Document Reviewed: 09/16/2018 Elsevier Patient Education  2020 [employer].

## 2020-09-12 NOTE — Assessment & Plan Note (Signed)
Preventative protocols reviewed and updated unless pt declined. Discussed healthy diet and lifestyle.  

## 2020-09-12 NOTE — Assessment & Plan Note (Signed)
Cough x 3 days with children ill at home but they have tested negative for COVID. She is fortunately fully immunized having completed Pfizer booster 07/2020 so low likelihood of this being COVID - but with line of work will send swab today.

## 2020-09-12 NOTE — Assessment & Plan Note (Signed)
Stable period on sertraline 50mg - desires to continue this.  

## 2020-09-12 NOTE — Assessment & Plan Note (Signed)
BP elevated today in setting of dayquil use. However, BP remains too high when feeling well (140/90s). Will increase lotrel to 5/20mg  daily. New dose sent to pharmacy.

## 2020-09-12 NOTE — Assessment & Plan Note (Signed)
Marked triglyceride elevation. Discussed relation to alcohol use. Encouraged cutting down. Reviewed other dietary measures to improve triglycerides. RTC 6 mo lab visit only to recheck this.  The ASCVD Risk score Denman George DC Jr., et al., 2013) failed to calculate for the following reasons:   The 2013 ASCVD risk score is only valid for ages 58 to 42

## 2020-09-12 NOTE — Assessment & Plan Note (Signed)
Congratulated on weight loss to date. Encouraged healthy diet and lifestyle choices to affect sustainable weight loss.  

## 2020-09-14 LAB — NOVEL CORONAVIRUS, NAA: SARS-CoV-2, NAA: NOT DETECTED

## 2020-09-14 LAB — SPECIMEN STATUS REPORT

## 2020-09-14 LAB — SARS-COV-2, NAA 2 DAY TAT

## 2020-09-17 ENCOUNTER — Other Ambulatory Visit: Payer: Self-pay | Admitting: Family Medicine

## 2020-09-18 NOTE — Telephone Encounter (Signed)
Changed to Amlodipine-Benazepril 5-20 on 09/12/20.

## 2021-02-15 ENCOUNTER — Ambulatory Visit: Payer: Managed Care, Other (non HMO) | Attending: Internal Medicine

## 2021-02-15 DIAGNOSIS — Z20822 Contact with and (suspected) exposure to covid-19: Secondary | ICD-10-CM

## 2021-02-17 LAB — NOVEL CORONAVIRUS, NAA: SARS-CoV-2, NAA: DETECTED — AB

## 2021-02-17 LAB — SARS-COV-2, NAA 2 DAY TAT

## 2021-02-18 ENCOUNTER — Telehealth: Payer: Self-pay | Admitting: Nurse Practitioner

## 2021-02-18 NOTE — Telephone Encounter (Signed)
Called to discuss with patient about COVID-19 symptoms and the use of one of the available treatments for those with mild to moderate Covid symptoms and at a high risk of hospitalization.  Pt appears to qualify for outpatient treatment due to co-morbid conditions and/or a member of an at-risk group in accordance with the FDA Emergency Use Authorization.    Symptom onset: 02/14/2021 Vaccinated: Yes Booster? Yes Immunocompromised? No Qualifiers: htn, obesity NIH Criteria: 3  Patient declining antiviral treatment, states symptoms are much better.   Willette Alma, NP COVID Treatment Team [phone]

## 2021-09-15 ENCOUNTER — Other Ambulatory Visit: Payer: Self-pay | Admitting: Family Medicine

## 2021-09-18 NOTE — Telephone Encounter (Signed)
Patient due for yearly appt; please call to get patient scheduled.

## 2021-09-18 NOTE — Telephone Encounter (Signed)
Lvm for pt to call office to get scheduled for lab/cpe also sent mychart letter

## 2021-09-18 NOTE — Telephone Encounter (Signed)
Refill request for amLODipine-benazepril (LOTREL) 5-20 MG capsule  LOV - 09/12/20 Next OV - not scheduled; message sent to the front staff to schedule Last refill - 09/12/20 #90/3

## 2021-09-19 ENCOUNTER — Telehealth: Payer: Self-pay | Admitting: Family Medicine

## 2021-09-19 MED ORDER — AMLODIPINE BESY-BENAZEPRIL HCL 5-20 MG PO CAPS: 1.0000 | ORAL_CAPSULE | Freq: Every day | ORAL | 0 refills | Status: DC

## 2021-09-19 MED ORDER — SERTRALINE HCL 50 MG PO TABS: 50.0000 mg | ORAL_TABLET | Freq: Every day | ORAL | 0 refills | Status: DC

## 2021-09-19 NOTE — Telephone Encounter (Signed)
2nd attempt lvm for pt to schedule lab/cpe

## 2021-09-19 NOTE — Telephone Encounter (Signed)
ERx 1 3 month supply

## 2021-09-19 NOTE — Telephone Encounter (Signed)
E-scribed sertraline.  Amlodipine-benazepril was sent earlier today.

## 2021-09-19 NOTE — Telephone Encounter (Signed)
°  Encourage patient to contact the pharmacy for refills or they can request refills through [facility]  LAST APPOINTMENT DATE:  Please schedule appointment if longer than 1 year  NEXT APPOINTMENT DATE:01/02/22  MEDICATION:sertraline (ZOLOFT) 50 MG tablet,amLODipine-benazepril (LOTREL) 5-20 MG capsule  Is the patient out of medication?   PHARMACY:[id number] [place], [place] - [address]  Let patient know to contact pharmacy at the end of the day to make sure medication is ready.  Please notify patient to allow 48-72 hours to process  CLINICAL FILLS OUT ALL BELOW:   LAST REFILL:  QTY:  REFILL DATE:    OTHER COMMENTS:    Okay for refill?  Please advise

## 2021-09-19 NOTE — Addendum Note (Signed)
Addended by: Eustaquio Boyden on: 09/19/2021 11:38 AM   Modules accepted: Orders

## 2021-12-03 IMAGING — US US ABDOMEN LIMITED
1 series · 14 of 25 positions shown · non-contrast
Comparison: None.

CLINICAL DATA: Abnormal gallbladder seen on CT imaging.

EXAM:
ULTRASOUND ABDOMEN LIMITED RIGHT UPPER QUADRANT

[Series 1: us abdomen limited · 14 of 33 slices shown]
[im 1/33]
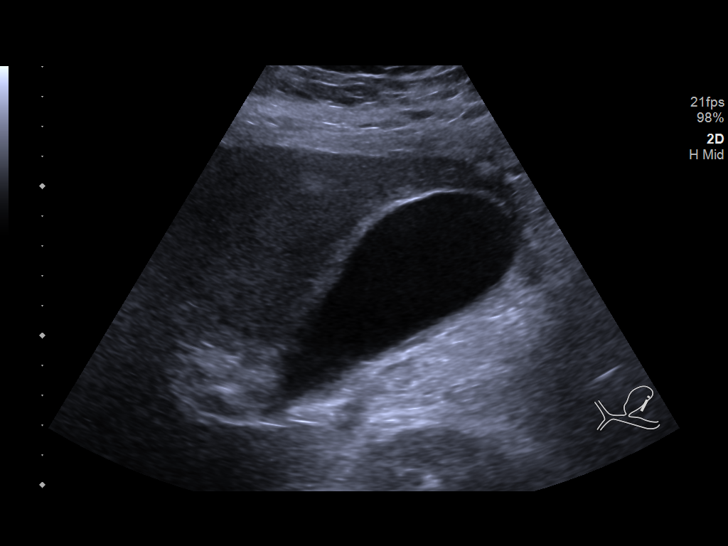
[im 3/33]
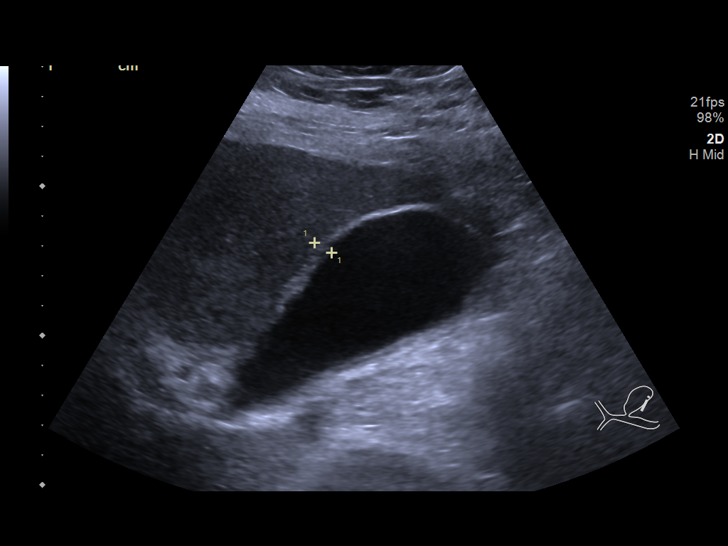
[im 6/33]
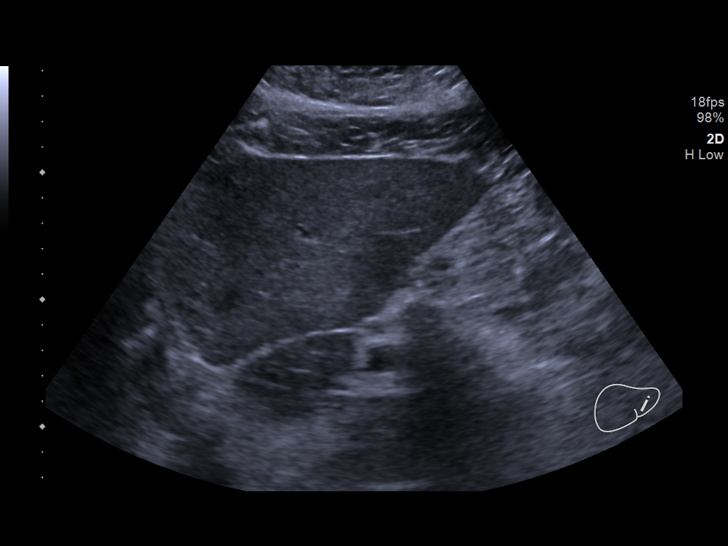
[im 9/33]
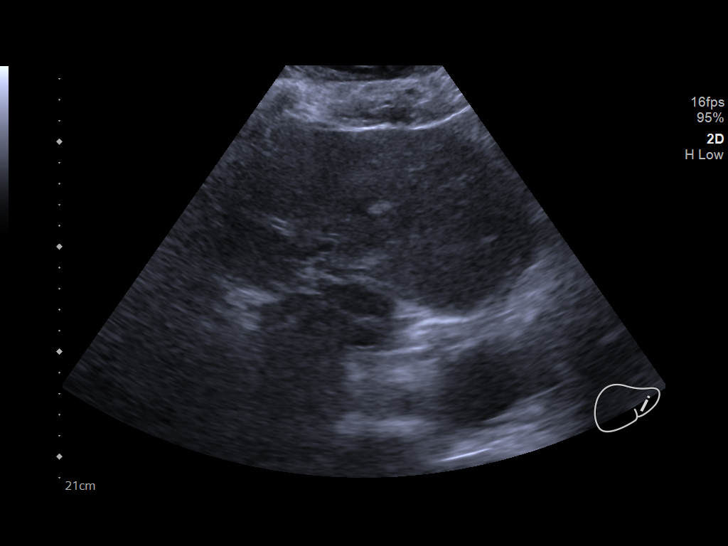
[im 11/33]
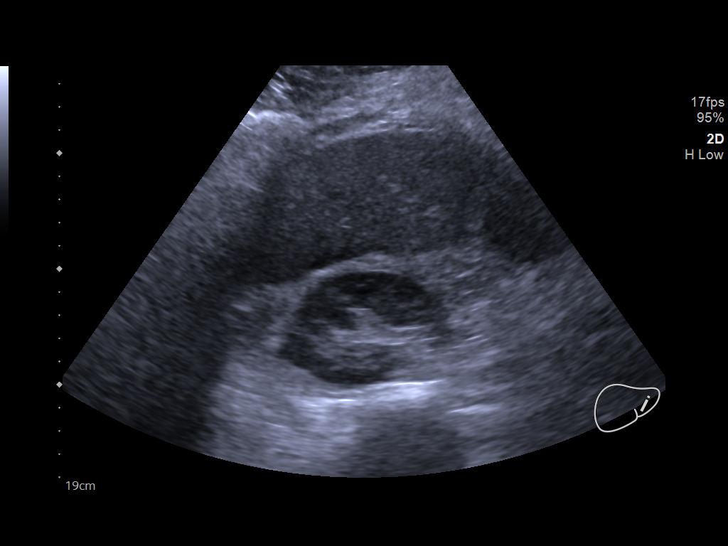
[im 13/33]
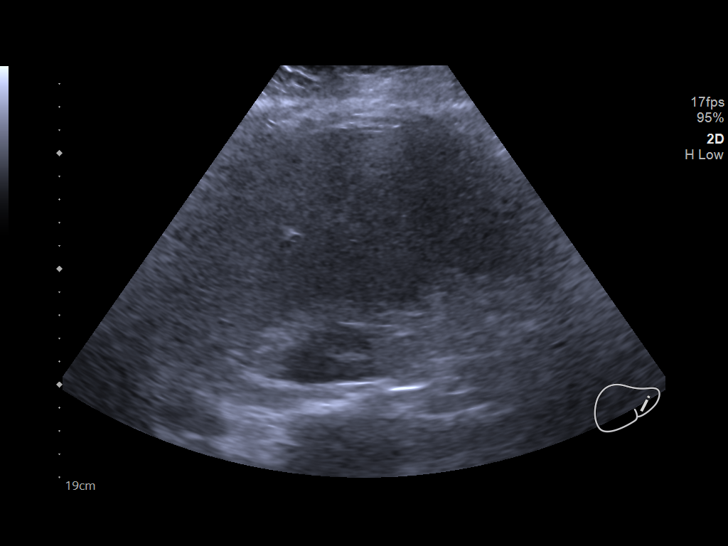
[im 15/33]
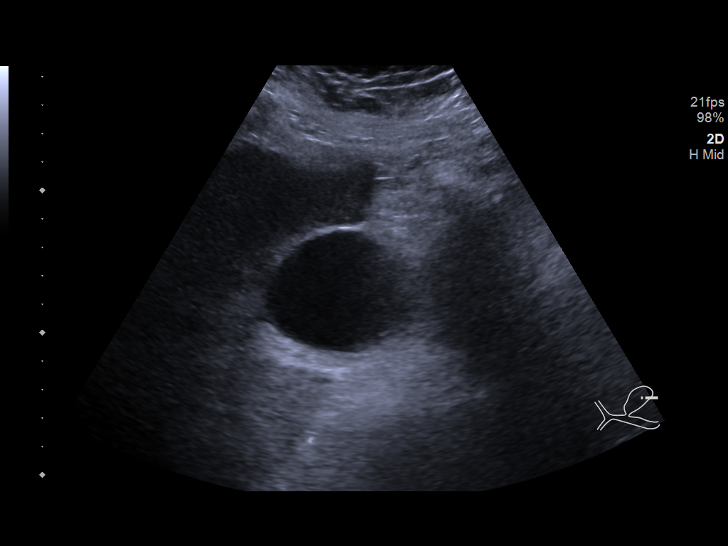
[im 18/33]
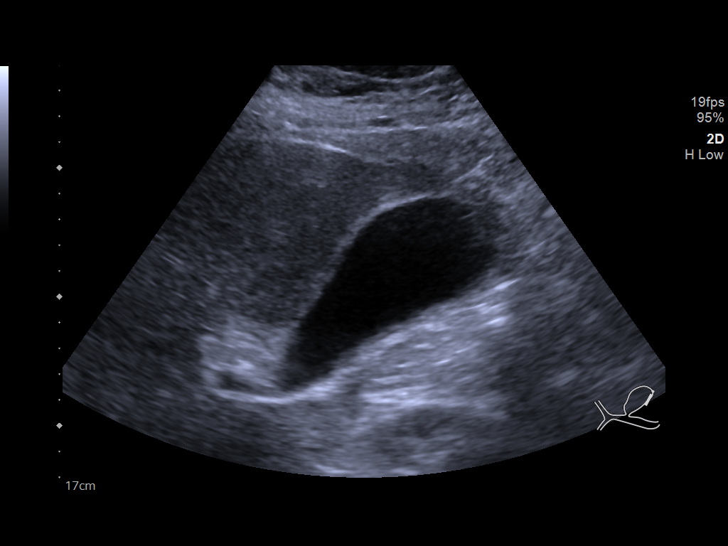
[im 21/33]
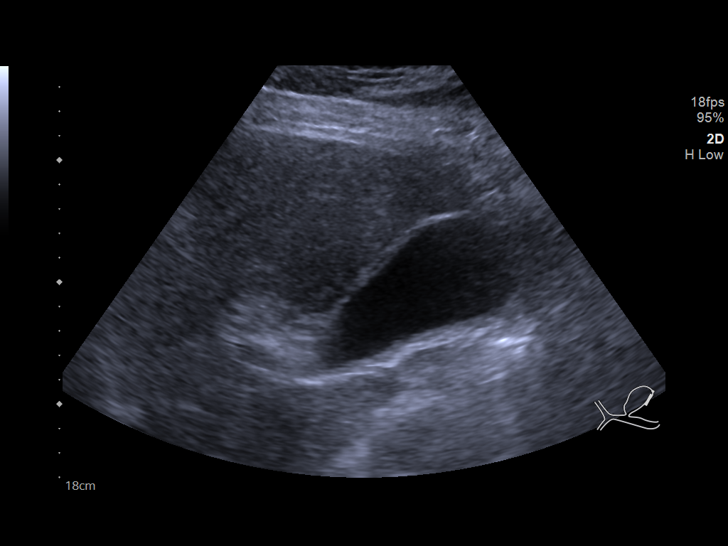
[im 22/33]
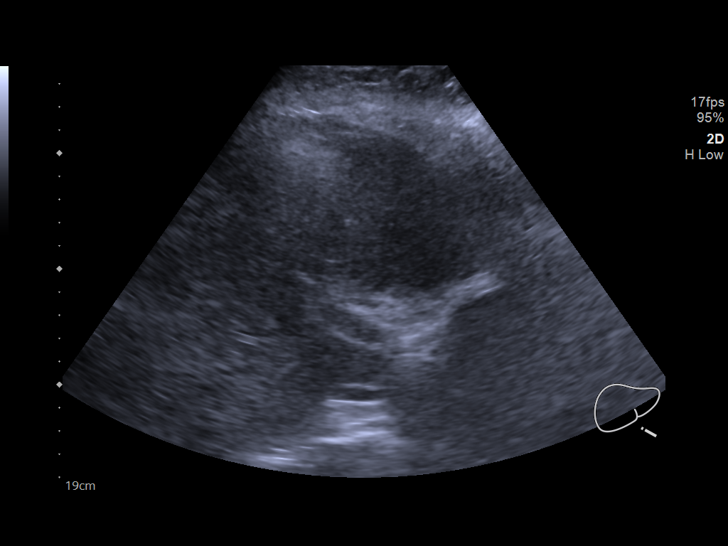
[im 25/33]
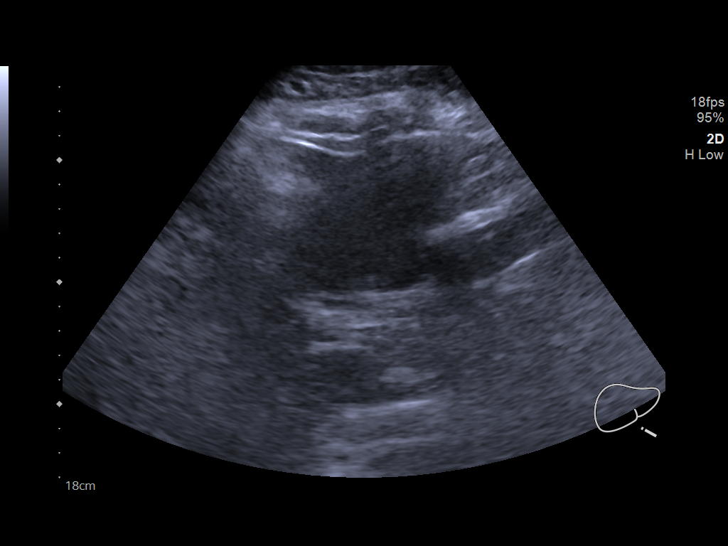
[im 27/33]
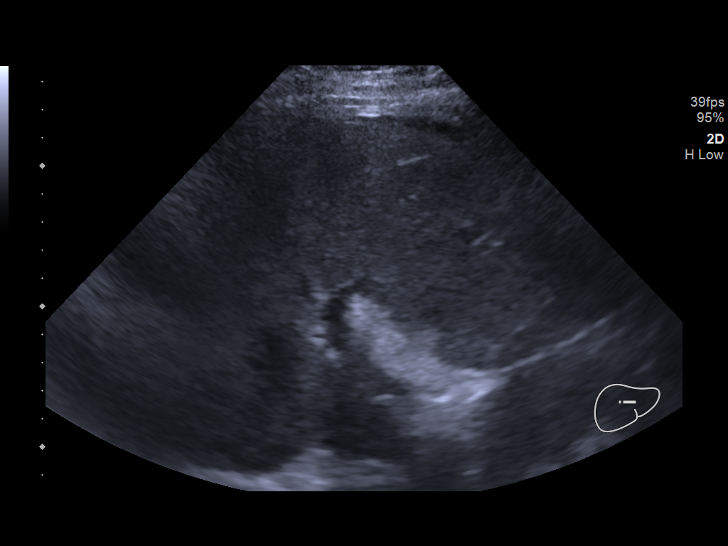
[im 30/33]
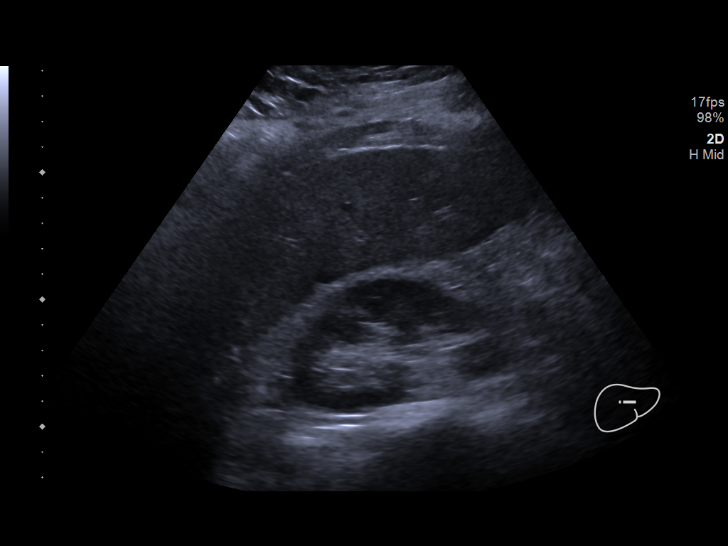
[im 33/33]
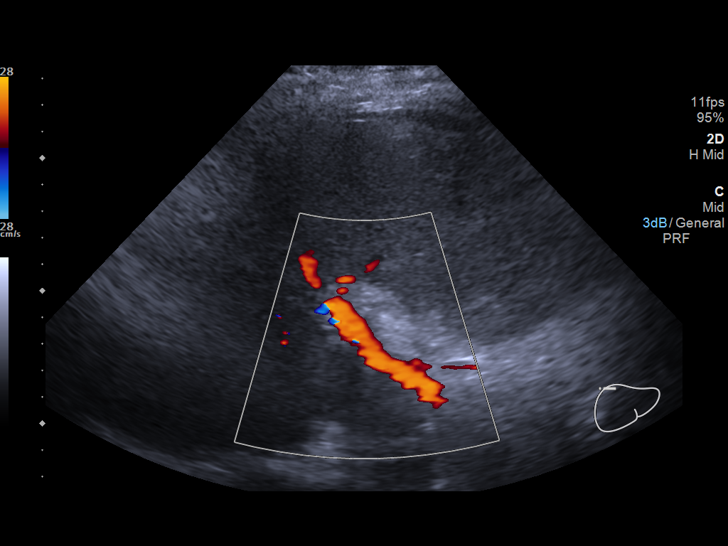

[14 of 25 positions shown; findings below may reference images not displayed]

FINDINGS: Gallbladder:

Gallbladder wall thickening is seen measuring 6.6 mm. No stones,
sludge, Murphy's sign, or pericholecystic fluid identified.

Common bile duct:

Diameter: 4.5 mm

Liver:

No focal lesion identified. Within normal limits in parenchymal
echogenicity. Portal vein is patent on color Doppler imaging with
normal direction of blood flow towards the liver.

Other: Perihepatic ascites identified.
IMPRESSION: 1. The gallbladder wall is thickened to 6.6 mm. However, no stones,
sludge, pericholecystic fluid, or Murphy's sign is identified. The
CT findings are very concerning for acute cholecystitis given wall
thickening and significant adjacent fat stranding. However, the
ultrasound findings are nonspecific, somewhat surprisingly. A HIDA
scan could be performed if the clinical picture is ambiguous.

## 2021-12-03 IMAGING — CT CT ABD-PELV W/ CM
2 of 4 series · 16 of 46 positions shown, 18 images · IV contrast (Omni 300)
Comparison: None.

CLINICAL DATA: Right upper quadrant pain. Cholecystitis suspected.
Cholecystitis versus appendicitis versus diverticulitis versus
stone.

EXAM:
CT ABDOMEN AND PELVIS WITH CONTRAST
TECHNIQUE: Multidetector CT imaging of the abdomen and pelvis was performed
using the standard protocol following bolus administration of
intravenous contrast.
CONTRAST:  100mL OMNIPAQUE IOHEXOL 300 MG/ML  SOLN

[Series 3: a/p w/ 5mm · axial · 0.92mm/px · z∈[+867,+1357]mm · 13 of 108 slices shown, 15 images]
[im 5/108  soft-tissue]
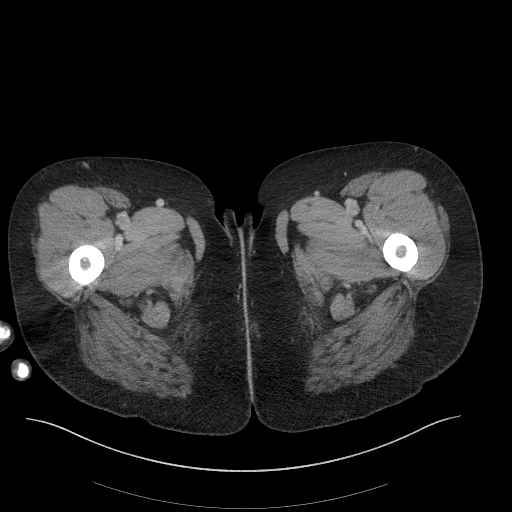
[im 5/108  bone]
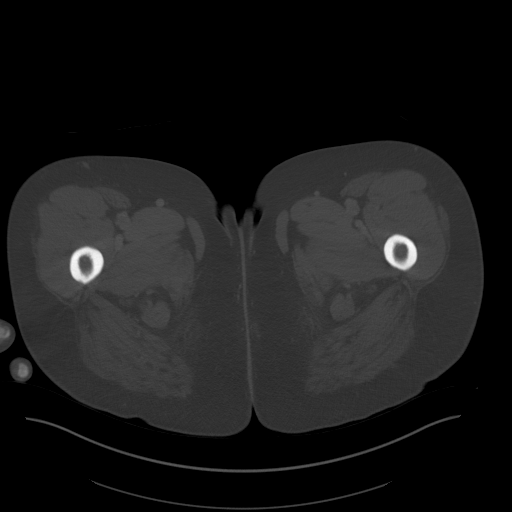
[im 13/108  soft-tissue]
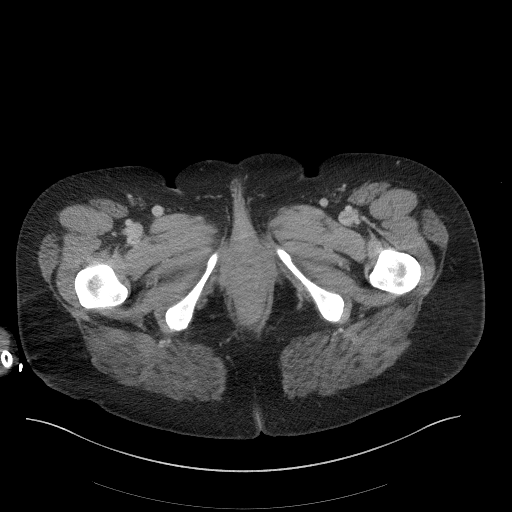
[im 21/108  soft-tissue]
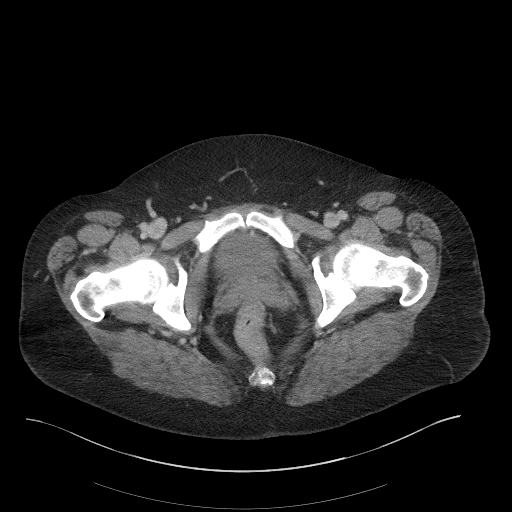
[im 29/108  soft-tissue]
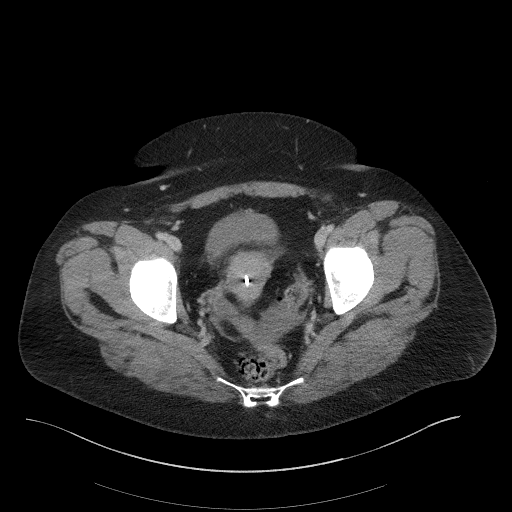
[im 38/108  soft-tissue]
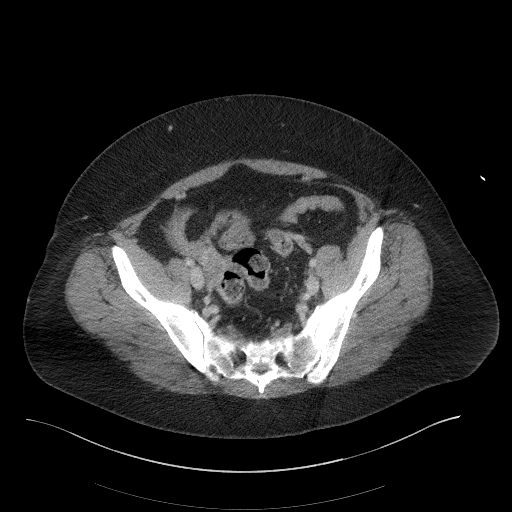
[im 46/108  soft-tissue]
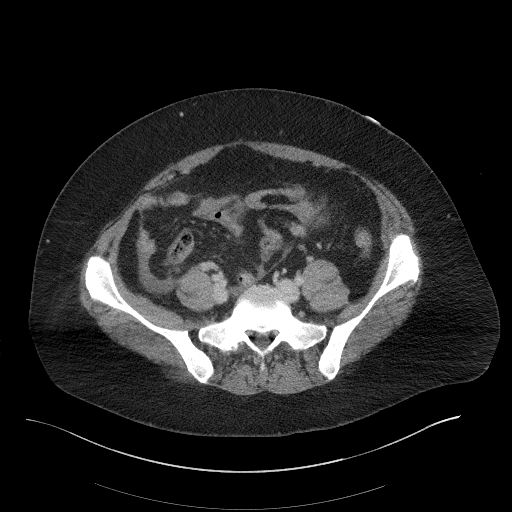
[im 54/108  soft-tissue]
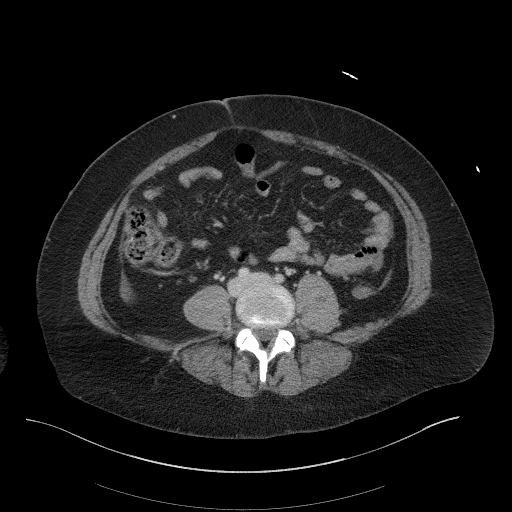
[im 62/108  soft-tissue]
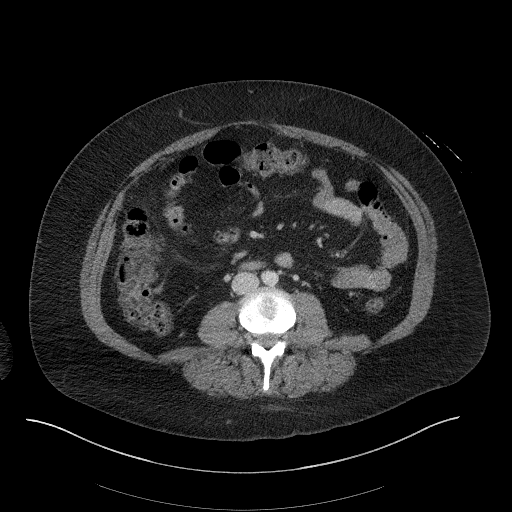
[im 70/108  soft-tissue]
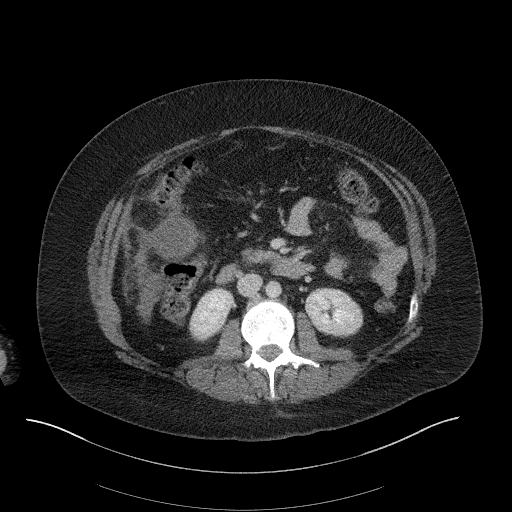
[im 70/108  bone]
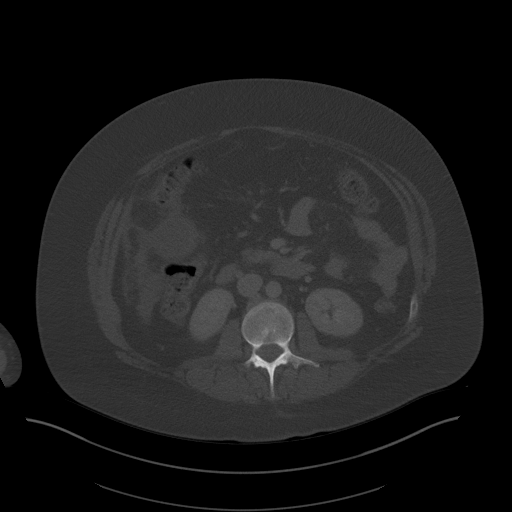
[im 79/108  soft-tissue]
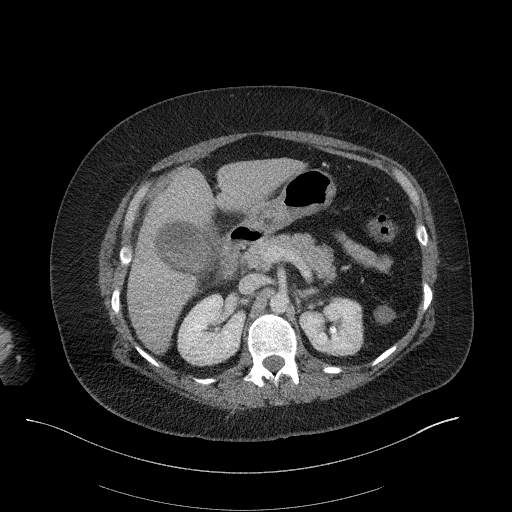
[im 87/108  soft-tissue]
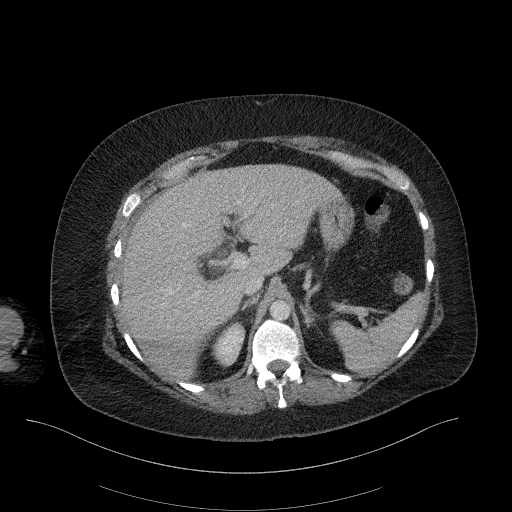
[im 95/108  soft-tissue]
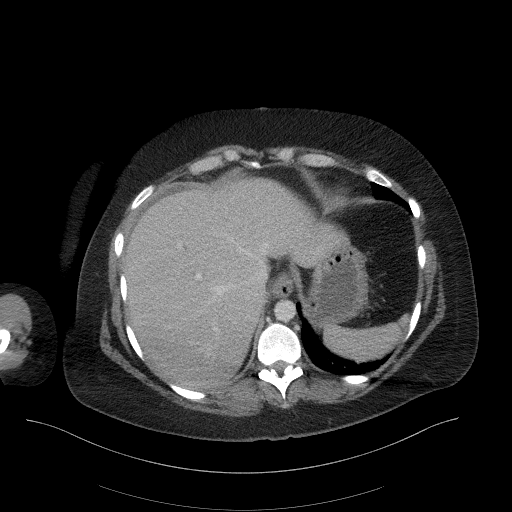
[im 103/108  soft-tissue]
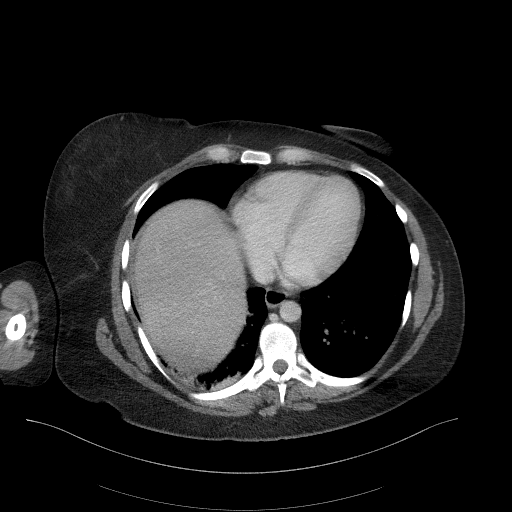

[Series 6: a/p w/ cor · coronal · 0.89mm/px · 3 of 212 slices shown]
[im 71/212  soft-tissue]
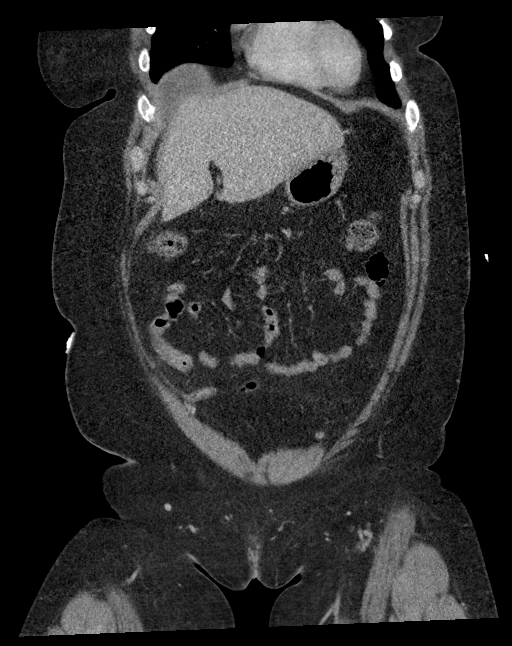
[im 94/212  soft-tissue]
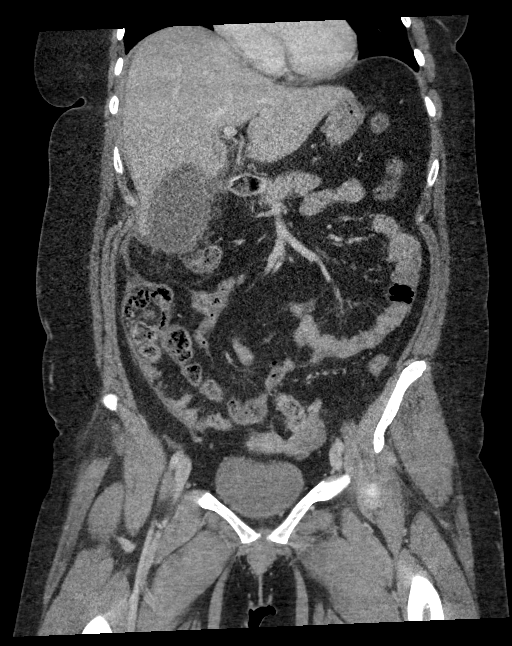
[im 118/212  soft-tissue]
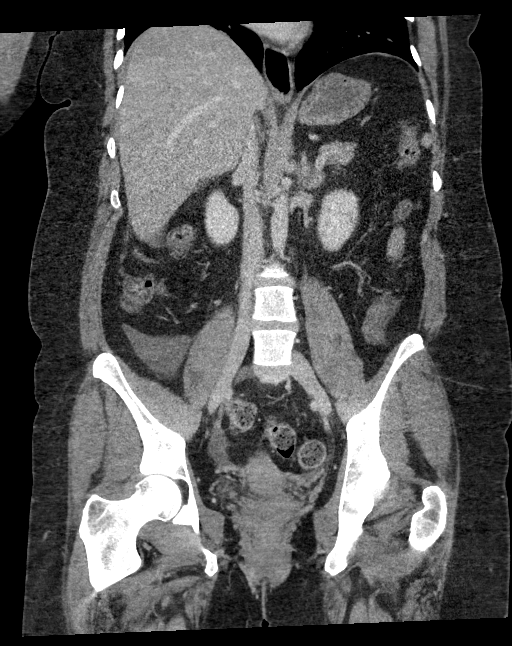

[16 of 46 positions shown; findings below may reference images not displayed]

FINDINGS: Lower chest: Mild dependent opacity in the right base is identified.
Lower chest is otherwise normal.

Hepatobiliary: The gallbladder demonstrates wall thickening and
adjacent fat stranding. No stones are definitely seen. The superior
most aspect of the liver dome was not included on today's study.
Visualized portions of the liver are normal. Portal vein is patent.

Pancreas: Unremarkable. No pancreatic ductal dilatation or
surrounding inflammatory changes.

Spleen: Normal in size without focal abnormality.

Adrenals/Urinary Tract: Adrenal glands are unremarkable. Kidneys are
normal, without renal calculi, focal lesion, or hydronephrosis.
Bladder is unremarkable.

Stomach/Bowel: The stomach, small bowel, and colon are normal.
Visualized portions of the appendix are normal with no evidence of
appendicitis.

Vascular/Lymphatic: No significant vascular findings are present. No
enlarged abdominal or pelvic lymph nodes.

Reproductive: An IUD is identified. The uterus and ovaries are
unremarkable.

Other: There is fluid adjacent to the liver, in the right pericolic
gutter, and in the pelvis.

Musculoskeletal: No acute or significant osseous findings.
IMPRESSION: 1. The gallbladder is distended with a thickened wall and adjacent
fat stranding. The findings are consistent with acute cholecystitis.
No stones are seen on this study. Ultrasound could better evaluate
if clinically warranted.
2. The fluid in the abdomen and pelvis is probably reactive from
significant cholecystitis. Bile from a perforated cholecystitis is
considered less likely. However, if there is concern, a nuclear
medicine biliary leak study could be performed.

## 2021-12-18 ENCOUNTER — Other Ambulatory Visit: Payer: Self-pay | Admitting: Family Medicine

## 2021-12-31 ENCOUNTER — Other Ambulatory Visit: Payer: Self-pay | Admitting: Family Medicine

## 2022-01-02 ENCOUNTER — Encounter: Payer: Managed Care, Other (non HMO) | Admitting: Family Medicine

## 2022-03-02 ENCOUNTER — Other Ambulatory Visit: Payer: Self-pay | Admitting: Family Medicine

## 2022-03-09 ENCOUNTER — Other Ambulatory Visit: Payer: Self-pay | Admitting: Family Medicine

## 2022-03-09 DIAGNOSIS — Z1159 Encounter for screening for other viral diseases: Secondary | ICD-10-CM

## 2022-03-09 DIAGNOSIS — I10 Essential (primary) hypertension: Secondary | ICD-10-CM

## 2022-03-09 DIAGNOSIS — E781 Pure hyperglyceridemia: Secondary | ICD-10-CM

## 2022-03-12 ENCOUNTER — Other Ambulatory Visit (INDEPENDENT_AMBULATORY_CARE_PROVIDER_SITE_OTHER): Payer: Managed Care, Other (non HMO)

## 2022-03-12 DIAGNOSIS — I10 Essential (primary) hypertension: Secondary | ICD-10-CM

## 2022-03-12 DIAGNOSIS — E781 Pure hyperglyceridemia: Secondary | ICD-10-CM

## 2022-03-12 DIAGNOSIS — Z1159 Encounter for screening for other viral diseases: Secondary | ICD-10-CM

## 2022-03-12 LAB — LIPID PANEL
Cholesterol: 174 mg/dL (ref 0–200)
HDL: 65 mg/dL (ref >39.00)
LDL Cholesterol: 70 mg/dL (ref 0–99)
NonHDL: 109.31
Total CHOL/HDL Ratio: 3
Triglycerides: 197 mg/dL — ABNORMAL HIGH (ref 0.0–149.0)
VLDL: 39.4 mg/dL (ref 0.0–40.0)

## 2022-03-12 LAB — CBC WITH DIFFERENTIAL/PLATELET
Basophils Absolute: 0 10*3/uL (ref 0.0–0.1)
Basophils Relative: 0.4 % (ref 0.0–3.0)
Eosinophils Absolute: 0.1 10*3/uL (ref 0.0–0.7)
Eosinophils Relative: 1.2 % (ref 0.0–5.0)
HCT: 40.1 % (ref 36.0–46.0)
Hemoglobin: 13.6 g/dL (ref 12.0–15.0)
Lymphocytes Relative: 37.1 % (ref 12.0–46.0)
Lymphs Abs: 2.8 10*3/uL (ref 0.7–4.0)
MCHC: 33.8 g/dL (ref 30.0–36.0)
MCV: 91.1 fl (ref 78.0–100.0)
Monocytes Absolute: 0.6 10*3/uL (ref 0.1–1.0)
Monocytes Relative: 7.7 % (ref 3.0–12.0)
Neutro Abs: 4 10*3/uL (ref 1.4–7.7)
Neutrophils Relative %: 53.6 % (ref 43.0–77.0)
Platelets: 262 10*3/uL (ref 150.0–400.0)
RBC: 4.41 Mil/uL (ref 3.87–5.11)
RDW: 12.3 % (ref 11.5–15.5)
WBC: 7.5 10*3/uL (ref 4.0–10.5)

## 2022-03-12 LAB — COMPREHENSIVE METABOLIC PANEL
ALT: 22 U/L (ref 0–35)
AST: 18 U/L (ref 0–37)
Albumin: 4.6 g/dL (ref 3.5–5.2)
Alkaline Phosphatase: 47 U/L (ref 39–117)
BUN: 12 mg/dL (ref 6–23)
CO2: 27 mEq/L (ref 19–32)
Calcium: 9.7 mg/dL (ref 8.4–10.5)
Chloride: 101 mEq/L (ref 96–112)
Creatinine, Ser: 0.71 mg/dL (ref 0.40–1.20)
GFR: 112.33 mL/min (ref >60.00)
Glucose, Bld: 82 mg/dL (ref 70–99)
Potassium: 4.1 mEq/L (ref 3.5–5.1)
Sodium: 137 mEq/L (ref 135–145)
Total Bilirubin: 1.2 mg/dL (ref 0.2–1.2)
Total Protein: 6.9 g/dL (ref 6.0–8.3)

## 2022-03-12 LAB — TSH: TSH: 2.55 u[IU]/mL (ref 0.35–5.50)

## 2022-03-13 LAB — HEPATITIS C ANTIBODY
Hepatitis C Ab: NONREACTIVE
SIGNAL TO CUT-OFF: 0.07 (ref <1.00)

## 2022-03-19 ENCOUNTER — Ambulatory Visit (INDEPENDENT_AMBULATORY_CARE_PROVIDER_SITE_OTHER): Payer: Managed Care, Other (non HMO) | Admitting: Family Medicine

## 2022-03-19 ENCOUNTER — Encounter: Payer: Self-pay | Admitting: Family Medicine

## 2022-03-19 VITALS — BP 122/82 | HR 73 | Temp 97.3°F | Ht 69.0 in | Wt 232.2 lb

## 2022-03-19 DIAGNOSIS — E669 Obesity, unspecified: Secondary | ICD-10-CM | POA: Diagnosis not present

## 2022-03-19 DIAGNOSIS — E785 Hyperlipidemia, unspecified: Secondary | ICD-10-CM

## 2022-03-19 DIAGNOSIS — Z0000 Encounter for general adult medical examination without abnormal findings: Secondary | ICD-10-CM

## 2022-03-19 DIAGNOSIS — I10 Essential (primary) hypertension: Secondary | ICD-10-CM | POA: Diagnosis not present

## 2022-03-19 DIAGNOSIS — F411 Generalized anxiety disorder: Secondary | ICD-10-CM | POA: Diagnosis not present

## 2022-03-19 DIAGNOSIS — J3081 Allergic rhinitis due to animal (cat) (dog) hair and dander: Secondary | ICD-10-CM

## 2022-03-19 MED ORDER — AMLODIPINE BESY-BENAZEPRIL HCL 5-20 MG PO CAPS
1.0000 | ORAL_CAPSULE | Freq: Every day | ORAL | 3 refills | Status: DC
Start: 2022-03-19 — End: 2023-04-07

## 2022-03-19 MED ORDER — AZELASTINE HCL 0.1 % NA SOLN: 1.0000 | Freq: Every day | NASAL | 3 refills | Status: DC

## 2022-03-19 MED ORDER — SERTRALINE HCL 50 MG PO TABS: 50.0000 mg | ORAL_TABLET | Freq: Every day | ORAL | 3 refills | Status: DC

## 2022-03-19 MED ORDER — CETIRIZINE HCL 10 MG PO TABS: 10.0000 mg | ORAL_TABLET | Freq: Every day | ORAL | 3 refills | Status: AC

## 2022-03-19 NOTE — Assessment & Plan Note (Signed)
Congratulated on healthy diet and lifestyle choices to date. Encouraged ongoing efforts towards sustainable weight loss.

## 2022-03-19 NOTE — Patient Instructions (Addendum)
You are doing well today  Medicines refilled for a year.  Congratulations on healthy diet and lifestyle changes!  Return as needed or in 1 year for next physical.   Health Maintenance, Female Adopting a healthy lifestyle and getting preventive care are important in promoting health and wellness. Ask your health care provider about: The right schedule for you to have regular tests and exams. Things you can do on your own to prevent diseases and keep yourself healthy. What should I know about diet, weight, and exercise? Eat a healthy diet  Eat a diet that includes plenty of vegetables, fruits, low-fat dairy products, and lean protein. Do not eat a lot of foods that are high in solid fats, added sugars, or sodium. Maintain a healthy weight Body mass index (BMI) is used to identify weight problems. It estimates body fat based on height and weight. Your health care provider can help determine your BMI and help you achieve or maintain a healthy weight. Get regular exercise Get regular exercise. This is one of the most important things you can do for your health. Most adults should: Exercise for at least 150 minutes each week. The exercise should increase your heart rate and make you sweat (moderate-intensity exercise). Do strengthening exercises at least twice a week. This is in addition to the moderate-intensity exercise. Spend less time sitting. Even light physical activity can be beneficial. Watch cholesterol and blood lipids Have your blood tested for lipids and cholesterol at 33 years of age, then have this test every 5 years. Have your cholesterol levels checked more often if: Your lipid or cholesterol levels are high. You are older than 33 years of age. You are at high risk for heart disease. What should I know about cancer screening? Depending on your health history and family history, you may need to have cancer screening at various ages. This may include screening for: Breast  cancer. Cervical cancer. Colorectal cancer. Skin cancer. Lung cancer. What should I know about heart disease, diabetes, and high blood pressure? Blood pressure and heart disease High blood pressure causes heart disease and increases the risk of stroke. This is more likely to develop in people who have high blood pressure readings or are overweight. Have your blood pressure checked: Every 3-5 years if you are 33-69 years of age. Every year if you are 33 years old or older. Diabetes Have regular diabetes screenings. This checks your fasting blood sugar level. Have the screening done: Once every three years after age 33 if you are at a normal weight and have a low risk for diabetes. More often and at a younger age if you are overweight or have a high risk for diabetes. What should I know about preventing infection? Hepatitis B If you have a higher risk for hepatitis B, you should be screened for this virus. Talk with your health care provider to find out if you are at risk for hepatitis B infection. Hepatitis C Testing is recommended for: Everyone born from [birth year] through 1967. Anyone with known risk factors for hepatitis C. Sexually transmitted infections (STIs) Get screened for STIs, including gonorrhea and chlamydia, if: You are sexually active and are younger than 33 years of age. You are older than 33 years of age and your health care provider tells you that you are at risk for this type of infection. Your sexual activity has changed since you were last screened, and you are at increased risk for chlamydia or gonorrhea. Ask your health care provider  if you are at risk. Ask your health care provider about whether you are at high risk for HIV. Your health care provider may recommend a prescription medicine to help prevent HIV infection. If you choose to take medicine to prevent HIV, you should first get tested for HIV. You should then be tested every 3 months for as long as you are taking  the medicine. Pregnancy If you are about to stop having your period (premenopausal) and you may become pregnant, seek counseling before you get pregnant. Take 400 to 800 micrograms (mcg) of folic acid every day if you become pregnant. Ask for birth control (contraception) if you want to prevent pregnancy. Osteoporosis and menopause Osteoporosis is a disease in which the bones lose minerals and strength with aging. This can result in bone fractures. If you are 33 years old or older, or if you are at risk for osteoporosis and fractures, ask your health care provider if you should: Be screened for bone loss. Take a calcium or vitamin D supplement to lower your risk of fractures. Be given hormone replacement therapy (HRT) to treat symptoms of menopause. Follow these instructions at home: Alcohol use Do not drink alcohol if: Your health care provider tells you not to drink. You are pregnant, may be pregnant, or are planning to become pregnant. If you drink alcohol: Limit how much you have to: 0-1 drink a day. Know how much alcohol is in your drink. In the [place] one drink equals one 12 oz bottle of beer (355 mL), one 5 oz glass of wine (148 mL), or one 1 oz glass of hard liquor (44 mL). Lifestyle Do not use any products that contain nicotine or tobacco. These products include cigarettes, chewing tobacco, and vaping devices, such as e-cigarettes. If you need help quitting, ask your health care provider. Do not use street drugs. Do not share needles. Ask your health care provider for help if you need support or information about quitting drugs. General instructions Schedule regular health, dental, and eye exams. Stay current with your vaccines. Tell your health care provider if: You often feel depressed. You have ever been abused or do not feel safe at home. Summary Adopting a healthy lifestyle and getting preventive care are important in promoting health and wellness. Follow your health  care provider's instructions about healthy diet, exercising, and getting tested or screened for diseases. Follow your health care provider's instructions on monitoring your cholesterol and blood pressure. This information is not intended to replace advice given to you by your health care provider. Make sure you discuss any questions you have with your health care provider. Document Revised: 02/12/2021 Document Reviewed: 02/12/2021 Elsevier Patient Education  2023 [employer].

## 2022-03-19 NOTE — Assessment & Plan Note (Signed)
Great control on current regimen - continue lotrel.  Reviewed side effect/ allergy of medications.

## 2022-03-19 NOTE — Assessment & Plan Note (Signed)
Preventative protocols reviewed and updated unless pt declined. Discussed healthy diet and lifestyle.  

## 2022-03-19 NOTE — Assessment & Plan Note (Signed)
Congratulated on recent healthy diet and lifestyle choices to improve cholesterol levels - triglycerides significantly improved from 400s to 190s. Reviewed dietary measures to help control triglycerides.  The ASCVD Risk score (Arnett DK, et al., 2019) failed to calculate for the following reasons:   The 2019 ASCVD risk score is only valid for ages 50 to 41

## 2022-03-19 NOTE — Assessment & Plan Note (Signed)
Stable period on sertraline 50mg  daily. Continue this.

## 2022-03-19 NOTE — Assessment & Plan Note (Signed)
Allergic symptoms (scratchy throat and itchy watery eyes) started with new dog - overall stable period on zyrtec + astelin.

## 2022-03-19 NOTE — Progress Notes (Signed)
Patient ID: Jennifer Mays, female    DOB: 1991/03/27, 31 y.o.   MRN: [id number]  This visit was conducted in person.  BP 122/82   Pulse 73   Temp (!) 97.3 F (36.3 C) (Temporal)   Ht 5\' 9"  (1.753 m)   Wt 232 lb 4 oz (105.3 kg)   LMP 02/23/2022   SpO2 99%   BMI 34.30 kg/m    CC: CPE Subjective:   HPI: Jennifer Mays is a 31 y.o. female presenting on 03/19/2022 for Annual Exam   New dog - may have developed allergies to this. Zyrtec and astelin nasal spray help.  HTN - compliant with lotrel 5/10mg  daily.   Preventative: Well woman exam - [facility] previous normal pap smears. IUD placed 2017. Upcoming GYN appt next month.  Flu - yearly COVID vaccine Pfizer 11/2019, 12/2019, booster 07/2020 Tdap 2016 Seat belt use discussed.  Sunscreen use discussed, no changing moles on skin Sleep - averaging 6 hours/night  Smoking - none  Alcohol - cut down - seldom about once monthly (wine) Dentist q6 mo Eye exam yearly   Lives with husband, 2 children, 1 dog Occ: [employer]  Edu: [employer] early education Activity: walking, has treadmill, walks dogs - 12000 steps/day  Diet: good fruits/vegetables, avoids fast foods/processed foods, good amt water     Relevant past medical, surgical, family and social history reviewed and updated as indicated. Interim medical history since our last visit reviewed. Allergies and medications reviewed and updated. Outpatient Medications Prior to Visit  Medication Sig Dispense Refill   omeprazole (PRILOSEC OTC) 20 MG tablet Take 20 mg by mouth daily.     PARAGARD INTRAUTERINE COPPER IU 1 Device by Intrauterine route.      amLODipine-benazepril (LOTREL) 5-20 MG capsule Take 1 capsule by mouth daily. NEEDS OFFICE VISIT 90 capsule 0   azelastine (ASTELIN) 0.1 % nasal spray Place 1 spray into both nostrils daily. Use in each nostril as directed     cetirizine (ZYRTEC) 10 MG tablet Take 10 mg by mouth daily.     sertraline (ZOLOFT) 50 MG  tablet TAKE 1 TABLET BY MOUTH EVERY DAY 90 tablet 0   No facility-administered medications prior to visit.     Per HPI unless specifically indicated in ROS section below Review of Systems  Constitutional:  Negative for activity change, appetite change, chills, fatigue, fever and unexpected weight change.  HENT:  Negative for hearing loss.   Eyes:  Negative for visual disturbance.  Respiratory:  Negative for cough, chest tightness, shortness of breath and wheezing.   Cardiovascular:  Negative for chest pain, palpitations and leg swelling.  Gastrointestinal:  Negative for abdominal distention, abdominal pain, blood in stool, constipation, diarrhea, nausea and vomiting.  Genitourinary:  Negative for difficulty urinating and hematuria.  Musculoskeletal:  Negative for arthralgias, myalgias and neck pain.  Skin:  Negative for rash.  Neurological:  Negative for dizziness, seizures, syncope and headaches.  Hematological:  Negative for adenopathy. Does not bruise/bleed easily.  Psychiatric/Behavioral:  Negative for dysphoric mood. The patient is not nervous/anxious.     Objective:  BP 122/82   Pulse 73   Temp (!) 97.3 F (36.3 C) (Temporal)   Ht 5\' 9"  (1.753 m)   Wt 232 lb 4 oz (105.3 kg)   LMP 02/23/2022   SpO2 99%   BMI 34.30 kg/m   Wt Readings from Last 3 Encounters:  03/19/22 232 lb 4 oz (105.3 kg)  09/12/20 223 lb (  101.2 kg)  05/14/19 237 lb (107.5 kg)      Physical Exam Vitals and nursing note reviewed.  Constitutional:      Appearance: Normal appearance. She is not ill-appearing.  HENT:     Head: Normocephalic and atraumatic.     Right Ear: Tympanic membrane, ear canal and external ear normal. There is no impacted cerumen.     Left Ear: Tympanic membrane, ear canal and external ear normal. There is no impacted cerumen.  Eyes:     General:        Right eye: No discharge.        Left eye: No discharge.     Extraocular Movements: Extraocular movements intact.      Conjunctiva/sclera: Conjunctivae normal.     Pupils: Pupils are equal, round, and reactive to light.  Neck:     Thyroid: No thyroid mass or thyromegaly.  Cardiovascular:     Rate and Rhythm: Normal rate and regular rhythm.     Pulses: Normal pulses.     Heart sounds: Normal heart sounds. No murmur heard. Pulmonary:     Effort: Pulmonary effort is normal. No respiratory distress.     Breath sounds: Normal breath sounds. No wheezing, rhonchi or rales.  Abdominal:     General: Bowel sounds are normal. There is no distension.     Palpations: Abdomen is soft. There is no mass.     Tenderness: There is no abdominal tenderness. There is no guarding or rebound.     Hernia: No hernia is present.  Musculoskeletal:     Cervical back: Normal range of motion and neck supple. No rigidity.     Right lower leg: No edema.     Left lower leg: No edema.  Lymphadenopathy:     Cervical: No cervical adenopathy.  Skin:    General: Skin is warm and dry.     Findings: No rash.  Neurological:     General: No focal deficit present.     Mental Status: She is alert. Mental status is at baseline.  Psychiatric:        Mood and Affect: Mood normal.        Behavior: Behavior normal.       Results for orders placed or performed in visit on 03/12/22  Hepatitis C antibody  Result Value Ref Range   Hepatitis C Ab NON-REACTIVE NON-REACTIVE   SIGNAL TO CUT-OFF 0.07 <1.00  TSH  Result Value Ref Range   TSH 2.55 0.35 - 5.50 uIU/mL  CBC with Differential/Platelet  Result Value Ref Range   WBC 7.5 4.0 - 10.5 K/uL   RBC 4.41 3.87 - 5.11 Mil/uL   Hemoglobin 13.6 12.0 - 15.0 g/dL   HCT 36.1 44.3 - 15.4 %   MCV 91.1 78.0 - 100.0 fl   MCHC 33.8 30.0 - 36.0 g/dL   RDW 00.8 67.6 - 19.5 %   Platelets 262.0 150.0 - 400.0 K/uL   Neutrophils Relative % 53.6 43.0 - 77.0 %   Lymphocytes Relative 37.1 12.0 - 46.0 %   Monocytes Relative 7.7 3.0 - 12.0 %   Eosinophils Relative 1.2 0.0 - 5.0 %   Basophils Relative 0.4  0.0 - 3.0 %   Neutro Abs 4.0 1.4 - 7.7 K/uL   Lymphs Abs 2.8 0.7 - 4.0 K/uL   Monocytes Absolute 0.6 0.1 - 1.0 K/uL   Eosinophils Absolute 0.1 0.0 - 0.7 K/uL   Basophils Absolute 0.0 0.0 - 0.1 K/uL  Comprehensive metabolic panel  Result Value Ref Range   Sodium 137 135 - 145 mEq/L   Potassium 4.1 3.5 - 5.1 mEq/L   Chloride 101 96 - 112 mEq/L   CO2 27 19 - 32 mEq/L   Glucose, Bld 82 70 - 99 mg/dL   BUN 12 6 - 23 mg/dL   Creatinine, Ser 1.61 0.40 - 1.20 mg/dL   Total Bilirubin 1.2 0.2 - 1.2 mg/dL   Alkaline Phosphatase 47 39 - 117 U/L   AST 18 0 - 37 U/L   ALT 22 0 - 35 U/L   Total Protein 6.9 6.0 - 8.3 g/dL   Albumin 4.6 3.5 - 5.2 g/dL   GFR 096.04 >54.09 mL/min   Calcium 9.7 8.4 - 10.5 mg/dL  Lipid panel  Result Value Ref Range   Cholesterol 174 0 - 200 mg/dL   Triglycerides 811.9 (H) 0.0 - 149.0 mg/dL   HDL 14.78 >29.56 mg/dL   VLDL 21.3 0.0 - 08.6 mg/dL   LDL Cholesterol 70 0 - 99 mg/dL   Total CHOL/HDL Ratio 3    NonHDL 109.31     Assessment & Plan:   Problem List Items Addressed This Visit     Health maintenance examination - Primary (Chronic)    Preventative protocols reviewed and updated unless pt declined. Discussed healthy diet and lifestyle.       HTN (hypertension)    Great control on current regimen - continue lotrel.  Reviewed side effect/ allergy of medications.       Relevant Medications   amLODipine-benazepril (LOTREL) 5-20 MG capsule   GAD (generalized anxiety disorder)    Stable period on sertraline  daily. Continue this.       Relevant Medications   sertraline (ZOLOFT) 50 MG tablet   Obesity, Class I, BMI 30.0-34.9 (see actual BMI)    Congratulated on healthy diet and lifestyle choices to date. Encouraged ongoing efforts towards sustainable weight loss.       Dyslipidemia    Congratulated on recent healthy diet and lifestyle choices to improve cholesterol levels - triglycerides significantly improved from 400s to 190s. Reviewed  dietary measures to help control triglycerides.  The ASCVD Risk score (Arnett DK, et al., 2019) failed to calculate for the following reasons:   The 2019 ASCVD risk score is only valid for ages 51 to 35       Allergic rhinitis due to animals    Allergic symptoms (scratchy throat and itchy watery eyes) started with new dog - overall stable period on zyrtec + astelin.         Meds ordered this encounter  Medications   amLODipine-benazepril (LOTREL) 5-20 MG capsule    Sig: Take 1 capsule by mouth daily.    Dispense:  90 capsule    Refill:  3   sertraline (ZOLOFT) 50 MG tablet    Sig: Take 1 tablet (50 mg total) by mouth daily.    Dispense:  90 tablet    Refill:  3   azelastine (ASTELIN) 0.1 % nasal spray    Sig: Place 1 spray into both nostrils daily. Use in each nostril as directed    Dispense:  90 mL    Refill:  3   cetirizine (ZYRTEC) 10 MG tablet    Sig: Take 1 tablet (10 mg total) by mouth daily.    Dispense:  90 tablet    Refill:  3   No orders of the defined types were placed in this encounter.   Patient instructions: You are  doing well today  Congratulations on healthy diet and lifestyle changes!  Return as needed or in 1 year for next physical.   Follow up plan: Return in about 1 year (around 03/20/2023) for annual exam, prior fasting for blood work.  Eustaquio BoydenJavier Danija Gosa, MD

## 2022-03-28 ENCOUNTER — Ambulatory Visit: Payer: Managed Care, Other (non HMO) | Admitting: Obstetrics

## 2022-04-18 ENCOUNTER — Ambulatory Visit: Payer: Managed Care, Other (non HMO) | Admitting: Obstetrics

## 2022-06-13 ENCOUNTER — Ambulatory Visit (INDEPENDENT_AMBULATORY_CARE_PROVIDER_SITE_OTHER): Payer: Managed Care, Other (non HMO) | Admitting: Obstetrics and Gynecology

## 2022-06-13 ENCOUNTER — Encounter: Payer: Self-pay | Admitting: Obstetrics and Gynecology

## 2022-06-13 ENCOUNTER — Other Ambulatory Visit (HOSPITAL_COMMUNITY)
Admission: RE | Admit: 2022-06-13 | Discharge: 2022-06-13 | Disposition: A | Discharge status: Home / Self Care | Payer: Managed Care, Other (non HMO) | Source: Ambulatory Visit | Attending: Obstetrics and Gynecology | Admitting: Obstetrics and Gynecology

## 2022-06-13 VITALS — BP 106/70 | Ht 69.0 in | Wt 235.0 lb

## 2022-06-13 DIAGNOSIS — Z1151 Encounter for screening for human papillomavirus (HPV): Secondary | ICD-10-CM | POA: Diagnosis present

## 2022-06-13 DIAGNOSIS — Z124 Encounter for screening for malignant neoplasm of cervix: Secondary | ICD-10-CM | POA: Insufficient documentation

## 2022-06-13 DIAGNOSIS — Z01419 Encounter for gynecological examination (general) (routine) without abnormal findings: Secondary | ICD-10-CM

## 2022-06-13 DIAGNOSIS — Z30431 Encounter for routine checking of intrauterine contraceptive device: Secondary | ICD-10-CM | POA: Diagnosis not present

## 2022-06-13 NOTE — Patient Instructions (Signed)
I value your feedback and you entrusting us with your care. If you get a [facility] patient survey, I would appreciate you taking the time to let us know about your experience today. Thank you! ? ? ?

## 2022-06-13 NOTE — Progress Notes (Signed)
PCP:  Eustaquio Boyden, MD   Chief Complaint  Patient presents with   Gynecologic Exam    No concerns     HPI:      Ms. Jennifer Mays is a 31 y.o. [id number] whose LMP was Patient's last menstrual period was 05/19/2022 (approximate)., presents today for her NP> 3 yrs annual examination.  Her menses are regular every 28-30 days, lasting 7 days with 2 heavy days with small clots; with IUD.  Dysmenorrhea mild to mod, improved with NSAIDs, rare BTB.   Sex activity: single partner, contraception - IUD. Paragard placed 10/22/15. Had BTB with several OCPs, nuvaring and depo in past. Happy with Paragard but may want different BC in future since menses heavier and with cramps. Ok for now.  Last Pap: 05/08/17  Results were: no abnormalities Hx of STDs: HPV; s/p LEEP 2007  There is no FH of breast cancer. There is no FH of ovarian cancer. The patient does not do self-breast exams.  Tobacco use: The patient denies current or previous tobacco use. Alcohol use: social drinker No drug use.  Exercise: moderately active  She does get adequate calcium but not Vitamin D in her diet.  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   GAD (generalized anxiety disorder) 08/20/2011   Contraception management 08/20/2011   HTN (hypertension) 07/31/2011   Stress 07/31/2011    Past Surgical History:  Procedure Laterality Date   CHOLECYSTECTOMY N/A 09/15/2019   LAPAROSCOPIC CHOLECYSTECTOMY WITH INTRAOPERATIVE CHOLANGIOGRAM;  Manus Rudd, MD;  Location: [facility] OR;  Service: General;  Laterality: N/A; at [facility]   INTRAUTERINE  DEVICE INSERTION  10/22/2015   LEEP  2007   TONSILLECTOMY AND ADENOIDECTOMY  2002    Family History  Problem Relation Age of Onset   Hypertension Father    Asthma Father    Asthma Sister    Parkinsonism Maternal Aunt    Hypertension Paternal Aunt    Hypertension Paternal Uncle    Alcohol abuse Maternal Grandfather    Coronary artery disease Paternal Grandmother 40       MI   Hypertension Paternal Grandmother    Diabetes Paternal Grandfather    Stroke Paternal Grandfather    Hypertension Paternal Grandfather    Cancer Neg Hx     Social History   Socioeconomic History   Marital status: Married    Spouse name: Not on file   Number of children: 2   Years of education: Not on file   Highest education level: Not on file  Occupational History   Occupation: Homemaker  Tobacco Use   Smoking status: Former    Types: Cigarettes    Quit date: 10/07/2012    Years since quitting: 9.6   Smokeless tobacco: Never  Vaping Use   Vaping Use: Never used  Substance and Sexual Activity   Alcohol use: Yes    Alcohol/week: 2.0 standard drinks of alcohol    Types: 2 Glasses of wine per week    Comment: drinks on weekends   Drug use: No   Sexual activity: Yes    Partners: Male    Birth control/protection: I.U.D.  Comment: Paraguard  Other Topics Concern   Not on file  Social History Narrative   Caffeine: 1 16 oz soda/day   Lives with husband, 2 children, 1 dog   Occ: [employer]    Edu: [employer] early education   Activity: walking, has treadmill    Diet: good fruits/vegetables, avoids fast foods/processed foods, good amt water   Social Determinants of Health   Financial Resource Strain: Not on file  Food Insecurity: Not on file  Transportation Needs: Not on file  Physical Activity: Not on file  Stress: Not on file  Social Connections: Not on file  Intimate Partner Violence: Not on file     Current Outpatient Medications:    amLODipine-benazepril (LOTREL) 5-20 MG  capsule, Take 1 capsule by mouth daily., Disp: 90 capsule, Rfl: 3   azelastine (ASTELIN) 0.1 % nasal spray, Place 1 spray into both nostrils daily. Use in each nostril as directed, Disp: 90 mL, Rfl: 3   cetirizine (ZYRTEC) 10 MG tablet, Take 1 tablet (10 mg total) by mouth daily., Disp: 90 tablet, Rfl: 3   omeprazole (PRILOSEC OTC) 20 MG tablet, Take 20 mg by mouth daily., Disp: , Rfl:    PARAGARD INTRAUTERINE COPPER IU, 1 Device by Intrauterine route. , Disp: , Rfl:    sertraline (ZOLOFT) 50 MG tablet, Take 1 tablet (50 mg total) by mouth daily., Disp: 90 tablet, Rfl: 3     ROS:  Review of Systems  Constitutional:  Negative for fatigue, fever and unexpected weight change.  Respiratory:  Negative for cough, shortness of breath and wheezing.   Cardiovascular:  Negative for chest pain, palpitations and leg swelling.  Gastrointestinal:  Negative for blood in stool, constipation, diarrhea, nausea and vomiting.  Endocrine: Negative for cold intolerance, heat intolerance and polyuria.  Genitourinary:  Negative for dyspareunia, dysuria, flank pain, frequency, genital sores, hematuria, menstrual problem, pelvic pain, urgency, vaginal bleeding, vaginal discharge and vaginal pain.  Musculoskeletal:  Negative for back pain, joint swelling and myalgias.  Skin:  Negative for rash.  Neurological:  Negative for dizziness, syncope, light-headedness, numbness and headaches.  Hematological:  Negative for adenopathy.  Psychiatric/Behavioral:  Negative for agitation, confusion, sleep disturbance and suicidal ideas. The patient is not nervous/anxious.    BREAST: No symptoms   Objective: BP 106/70   Ht 5\' 9"  (1.753 m)   Wt 235 lb (106.6 kg)   LMP 05/19/2022 (Approximate)   BMI 34.70 kg/m    Physical Exam Constitutional:      Appearance: She is well-developed.  Genitourinary:     Vulva normal.     Right Labia: No rash, tenderness or lesions.    Left Labia: No tenderness, lesions or rash.    No  vaginal discharge, erythema or tenderness.      Right Adnexa: not tender and no mass present.    Left Adnexa: not tender and no mass present.    No cervical friability or polyp.     IUD strings visualized.     Uterus is not enlarged or tender.  Breasts:    Right: No mass, nipple discharge, skin change or tenderness.     Left: No mass, nipple discharge, skin change or tenderness.  Neck:     Thyroid: No thyromegaly.  Cardiovascular:     Rate and Rhythm: Normal rate and regular rhythm.     Heart sounds: Normal heart sounds. No murmur heard. Pulmonary:     Effort: Pulmonary effort is normal.     Breath sounds:  Normal breath sounds.  Abdominal:     Palpations: Abdomen is soft.     Tenderness: There is no abdominal tenderness. There is no guarding or rebound.  Musculoskeletal:        General: Normal range of motion.     Cervical back: Normal range of motion.  Lymphadenopathy:     Cervical: No cervical adenopathy.  Neurological:     General: No focal deficit present.     Mental Status: She is alert and oriented to person, place, and time.     Cranial Nerves: No cranial nerve deficit.  Skin:    General: Skin is warm and dry.  Psychiatric:        Mood and Affect: Mood normal.        Behavior: Behavior normal.        Thought Content: Thought content normal.        Judgment: Judgment normal.  Vitals reviewed.    Assessment/Plan: Encounter for annual routine gynecological examination  Cervical cancer screening - Plan: Cytology - PAP  Screening for HPV (human papillomavirus) - Plan: Cytology - PAP  Encounter for routine checking of intrauterine contraceptive device (IUD)--IUD strings in place; has 10 yr indication. Discussed nexplanon and Mirena.         GYN counsel adequate intake of calcium and vitamin D, diet and exercise     F/U  Return in about 1 year (around 06/14/2023).  Denesia Donelan B. Samael Blades, PA-C 06/13/2022 4:57 PM

## 2022-06-20 LAB — CYTOLOGY - PAP
Comment: NEGATIVE
Diagnosis: UNDETERMINED — AB
High risk HPV: NEGATIVE

## 2022-09-03 ENCOUNTER — Ambulatory Visit (HOSPITAL_COMMUNITY): Payer: Managed Care, Other (non HMO)

## 2023-04-06 ENCOUNTER — Other Ambulatory Visit: Payer: Self-pay | Admitting: Family Medicine

## 2023-04-06 DIAGNOSIS — I10 Essential (primary) hypertension: Secondary | ICD-10-CM

## 2023-04-06 DIAGNOSIS — F411 Generalized anxiety disorder: Secondary | ICD-10-CM

## 2023-04-07 NOTE — Telephone Encounter (Signed)
E-scribed refills.  Plz schedule CPE and fasting lab (no food/drink- except water and/or blk coffee 5 hrs prior) visits for additional refills.  

## 2023-04-08 NOTE — Telephone Encounter (Signed)
Noted  

## 2023-04-08 NOTE — Telephone Encounter (Signed)
Patient has been scheduled

## 2023-06-16 NOTE — Progress Notes (Deleted)
PCP:  Eustaquio Boyden, MD   No chief complaint on file.    HPI:      Ms. Jennifer Mays is a 32 y.o. [id number] whose LMP was No LMP recorded. (Menstrual status: IUD)., presents today for her NP> 3 yrs annual examination.  Her menses are regular every 28-30 days, lasting 7 days with 2 heavy days with small clots; with IUD.  Dysmenorrhea mild to mod, improved with NSAIDs, rare BTB.   Sex activity: single partner, contraception - IUD. Paragard placed 10/22/15. Had BTB with several OCPs, nuvaring and depo in past. Happy with Paragard but may want different BC in future since menses heavier and with cramps. Ok for now.  Last Pap: 06/13/22 Results were: ASCUS/ neg HPV DNA Hx of STDs: HPV; s/p LEEP 2007  There is no FH of breast cancer. There is no FH of ovarian cancer. The patient does not do self-breast exams.  Tobacco use: The patient denies current or previous tobacco use. Alcohol use: social drinker No drug use.  Exercise: moderately active  She does get adequate calcium but not Vitamin D in her diet.  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 03/19/2022   Encounter for issuance of medical certificate 14/78/2956   Health maintenance examination 12/11/2018   Dyslipidemia 12/01/2018   Chronic venous insufficiency 11/30/2018   Obesity, Class I, BMI 30.0-34.9 (see actual BMI) 11/09/2018   Costochondritis 04/23/2012   Varicose veins of bilateral lower extremities with pain 03/26/2012   GAD (generalized anxiety disorder) 08/20/2011   Contraception management 08/20/2011   HTN (hypertension) 07/31/2011   Stress 07/31/2011    Past Surgical History:  Procedure Laterality Date   CHOLECYSTECTOMY N/A 09/15/2019   LAPAROSCOPIC CHOLECYSTECTOMY WITH INTRAOPERATIVE CHOLANGIOGRAM;  Manus Rudd, MD;  Location: [facility] OR;  Service: General;  Laterality: N/A; at [facility]   INTRAUTERINE DEVICE INSERTION  10/22/2015   LEEP  2007   TONSILLECTOMY AND  ADENOIDECTOMY  2002    Family History  Problem Relation Age of Onset   Hypertension Father    Asthma Father    Asthma Sister    Parkinsonism Maternal Aunt    Hypertension Paternal Aunt    Hypertension Paternal Uncle    Alcohol abuse Maternal Grandfather    Coronary artery disease Paternal Grandmother 31       MI   Hypertension Paternal Grandmother    Diabetes Paternal Grandfather    Stroke Paternal Grandfather    Hypertension Paternal Grandfather    Cancer Neg Hx     Social History   Socioeconomic History   Marital status: Married    Spouse name: Not on file   Number of children: 2   Years of education: Not on file   Highest education level: Not on file  Occupational History   Occupation: Homemaker  Tobacco Use   Smoking status: Former    Current packs/day: 0.00    Types: Cigarettes    Quit date: 10/07/2012    Years since quitting: 10.6   Smokeless tobacco: Never  Vaping Use   Vaping status: Never Used  Substance and Sexual Activity   Alcohol use: Yes    Alcohol/week: 2.0 standard drinks of alcohol    Types: 2 Glasses of wine per week    Comment: drinks on weekends   Drug use: No   Sexual activity: Yes    Partners: Male    Birth control/protection: I.U.D.    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   Activity: walking, has treadmill    Diet: good fruits/vegetables, avoids fast foods/processed foods, good amt water   Social Determinants of Health   Financial Resource Strain: Not on file  Food Insecurity: Not on file  Transportation Needs: Not on file  Physical Activity: Not on file  Stress: Not on file  Social Connections: Not on file  Intimate Partner Violence: Not on file     Current Outpatient Medications:    amLODipine-benazepril (LOTREL) 5-20 MG capsule, TAKE 1 CAPSULE BY MOUTH  EVERY DAY, Disp: 90 capsule, Rfl: 0   azelastine (ASTELIN) 0.1 % nasal spray, Place 1 spray into both nostrils daily. Use in each nostril as directed, Disp: 90 mL, Rfl: 3   cetirizine (ZYRTEC) 10 MG tablet, Take 1 tablet (10 mg total) by mouth daily., Disp: 90 tablet, Rfl: 3   omeprazole (PRILOSEC OTC) 20 MG tablet, Take 20 mg by mouth daily., Disp: , Rfl:    PARAGARD INTRAUTERINE COPPER IU, 1 Device by Intrauterine route. , Disp: , Rfl:    sertraline (ZOLOFT) 50 MG tablet, TAKE 1 TABLET BY MOUTH EVERY DAY, Disp: 90 tablet, Rfl: 0     ROS:  Review of Systems  Constitutional:  Negative for fatigue, fever and unexpected weight change.  Respiratory:  Negative for cough, shortness of breath and wheezing.   Cardiovascular:  Negative for chest pain, palpitations and leg swelling.  Gastrointestinal:  Negative for blood in stool, constipation, diarrhea, nausea and vomiting.  Endocrine: Negative for cold intolerance, heat intolerance and polyuria.  Genitourinary:  Negative for dyspareunia, dysuria, flank pain, frequency, genital sores, hematuria, menstrual problem, pelvic pain, urgency, vaginal bleeding, vaginal discharge and vaginal pain.  Musculoskeletal:  Negative for back pain, joint swelling and myalgias.  Skin:  Negative for rash.  Neurological:  Negative for dizziness, syncope, light-headedness, numbness and headaches.  Hematological:  Negative for adenopathy.  Psychiatric/Behavioral:  Negative for agitation, confusion, sleep disturbance and suicidal ideas. The patient is not nervous/anxious.    BREAST: No symptoms   Objective: There were no vitals taken for this visit.   Physical Exam Constitutional:      Appearance: She is well-developed.  Genitourinary:     Vulva normal.     Right Labia: No rash, tenderness or lesions.    Left Labia: No tenderness, lesions or rash.    No vaginal discharge, erythema or tenderness.      Right Adnexa: not tender and no mass present.    Left  Adnexa: not tender and no mass present.    No cervical friability or polyp.     IUD strings visualized.     Uterus is not enlarged or tender.  Breasts:    Right: No mass, nipple discharge, skin change or tenderness.     Left: No mass, nipple discharge, skin change or tenderness.  Neck:     Thyroid: No thyromegaly.  Cardiovascular:     Rate and Rhythm: Normal rate and regular rhythm.     Heart sounds: Normal heart sounds. No murmur heard. Pulmonary:     Effort: Pulmonary effort is normal.     Breath sounds: Normal breath sounds.  Abdominal:     Palpations: Abdomen is soft.     Tenderness: There is no abdominal tenderness. There is  no guarding or rebound.  Musculoskeletal:        General: Normal range of motion.     Cervical back: Normal range of motion.  Lymphadenopathy:     Cervical: No cervical adenopathy.  Neurological:     General: No focal deficit present.     Mental Status: She is alert and oriented to person, place, and time.     Cranial Nerves: No cranial nerve deficit.  Skin:    General: Skin is warm and dry.  Psychiatric:        Mood and Affect: Mood normal.        Behavior: Behavior normal.        Thought Content: Thought content normal.        Judgment: Judgment normal.  Vitals reviewed.    Assessment/Plan: Encounter for annual routine gynecological examination  Cervical cancer screening - Plan: Cytology - PAP  Screening for HPV (human papillomavirus) - Plan: Cytology - PAP  Encounter for routine checking of intrauterine contraceptive device (IUD)--IUD strings in place; has 10 yr indication. Discussed nexplanon and Mirena.         GYN counsel adequate intake of calcium and vitamin D, diet and exercise     F/U  No follow-ups on file.  Lorenso Quirino B. Emoni Whitworth, PA-C 06/16/2023 12:58 PM

## 2023-06-17 ENCOUNTER — Ambulatory Visit: Payer: Managed Care, Other (non HMO) | Admitting: Obstetrics and Gynecology

## 2023-06-17 DIAGNOSIS — R87610 Atypical squamous cells of undetermined significance on cytologic smear of cervix (ASC-US): Secondary | ICD-10-CM

## 2023-06-17 DIAGNOSIS — Z30431 Encounter for routine checking of intrauterine contraceptive device: Secondary | ICD-10-CM

## 2023-06-17 DIAGNOSIS — Z124 Encounter for screening for malignant neoplasm of cervix: Secondary | ICD-10-CM

## 2023-06-17 DIAGNOSIS — Z01419 Encounter for gynecological examination (general) (routine) without abnormal findings: Secondary | ICD-10-CM

## 2023-07-06 ENCOUNTER — Other Ambulatory Visit: Payer: Self-pay | Admitting: Family Medicine

## 2023-07-06 DIAGNOSIS — I10 Essential (primary) hypertension: Secondary | ICD-10-CM

## 2023-07-18 ENCOUNTER — Other Ambulatory Visit: Payer: Self-pay | Admitting: Family Medicine

## 2023-07-18 ENCOUNTER — Other Ambulatory Visit: Payer: Managed Care, Other (non HMO)

## 2023-07-18 DIAGNOSIS — F411 Generalized anxiety disorder: Secondary | ICD-10-CM

## 2023-07-21 ENCOUNTER — Other Ambulatory Visit: Payer: Managed Care, Other (non HMO)

## 2023-07-21 ENCOUNTER — Other Ambulatory Visit: Payer: Self-pay | Admitting: Family Medicine

## 2023-07-21 DIAGNOSIS — E785 Hyperlipidemia, unspecified: Secondary | ICD-10-CM

## 2023-07-21 DIAGNOSIS — I10 Essential (primary) hypertension: Secondary | ICD-10-CM

## 2023-07-22 ENCOUNTER — Other Ambulatory Visit (INDEPENDENT_AMBULATORY_CARE_PROVIDER_SITE_OTHER): Payer: Managed Care, Other (non HMO)

## 2023-07-22 DIAGNOSIS — I10 Essential (primary) hypertension: Secondary | ICD-10-CM | POA: Diagnosis not present

## 2023-07-22 DIAGNOSIS — E785 Hyperlipidemia, unspecified: Secondary | ICD-10-CM

## 2023-07-23 LAB — COMPREHENSIVE METABOLIC PANEL
ALT: 21 U/L (ref 0–35)
AST: 21 U/L (ref 0–37)
Albumin: 4.5 g/dL (ref 3.5–5.2)
Alkaline Phosphatase: 47 U/L (ref 39–117)
BUN: 17 mg/dL (ref 6–23)
CO2: 26 meq/L (ref 19–32)
Calcium: 9.5 mg/dL (ref 8.4–10.5)
Chloride: 101 meq/L (ref 96–112)
Creatinine, Ser: 0.9 mg/dL (ref 0.40–1.20)
GFR: 83.7 mL/min (ref >60.00)
Glucose, Bld: 79 mg/dL (ref 70–99)
Potassium: 3.9 meq/L (ref 3.5–5.1)
Sodium: 135 meq/L (ref 135–145)
Total Bilirubin: 1 mg/dL (ref 0.2–1.2)
Total Protein: 7.2 g/dL (ref 6.0–8.3)

## 2023-07-23 LAB — LIPID PANEL
Cholesterol: 197 mg/dL (ref 0–200)
HDL: 70.5 mg/dL (ref >39.00)
LDL Cholesterol: 94 mg/dL (ref 0–99)
NonHDL: 126.38
Total CHOL/HDL Ratio: 3
Triglycerides: 160 mg/dL — ABNORMAL HIGH (ref 0.0–149.0)
VLDL: 32 mg/dL (ref 0.0–40.0)

## 2023-07-23 LAB — CBC WITH DIFFERENTIAL/PLATELET
Basophils Absolute: 0 10*3/uL (ref 0.0–0.1)
Basophils Relative: 0.2 % (ref 0.0–3.0)
Eosinophils Absolute: 0.1 10*3/uL (ref 0.0–0.7)
Eosinophils Relative: 0.7 % (ref 0.0–5.0)
HCT: 42.3 % (ref 36.0–46.0)
Hemoglobin: 13.9 g/dL (ref 12.0–15.0)
Lymphocytes Relative: 38 % (ref 12.0–46.0)
Lymphs Abs: 3.2 10*3/uL (ref 0.7–4.0)
MCHC: 32.9 g/dL (ref 30.0–36.0)
MCV: 96.3 fL (ref 78.0–100.0)
Monocytes Absolute: 0.8 10*3/uL (ref 0.1–1.0)
Monocytes Relative: 9.9 % (ref 3.0–12.0)
Neutro Abs: 4.3 10*3/uL (ref 1.4–7.7)
Neutrophils Relative %: 51.2 % (ref 43.0–77.0)
Platelets: 293 10*3/uL (ref 150.0–400.0)
RBC: 4.39 Mil/uL (ref 3.87–5.11)
RDW: 12.6 % (ref 11.5–15.5)
WBC: 8.4 10*3/uL (ref 4.0–10.5)

## 2023-07-25 ENCOUNTER — Encounter: Payer: Self-pay | Admitting: Family Medicine

## 2023-07-25 ENCOUNTER — Ambulatory Visit: Payer: Managed Care, Other (non HMO) | Admitting: Family Medicine

## 2023-07-25 VITALS — BP 136/92 | HR 75 | Temp 99.1°F | Ht 69.25 in | Wt 234.2 lb

## 2023-07-25 DIAGNOSIS — J3081 Allergic rhinitis due to animal (cat) (dog) hair and dander: Secondary | ICD-10-CM

## 2023-07-25 DIAGNOSIS — F439 Reaction to severe stress, unspecified: Secondary | ICD-10-CM

## 2023-07-25 DIAGNOSIS — Z0000 Encounter for general adult medical examination without abnormal findings: Secondary | ICD-10-CM

## 2023-07-25 DIAGNOSIS — I10 Essential (primary) hypertension: Secondary | ICD-10-CM

## 2023-07-25 DIAGNOSIS — R21 Rash and other nonspecific skin eruption: Secondary | ICD-10-CM | POA: Insufficient documentation

## 2023-07-25 DIAGNOSIS — E66811 Obesity, class 1: Secondary | ICD-10-CM | POA: Diagnosis not present

## 2023-07-25 DIAGNOSIS — Z6834 Body mass index (BMI) 34.0-34.9, adult: Secondary | ICD-10-CM

## 2023-07-25 DIAGNOSIS — E785 Hyperlipidemia, unspecified: Secondary | ICD-10-CM

## 2023-07-25 DIAGNOSIS — F411 Generalized anxiety disorder: Secondary | ICD-10-CM

## 2023-07-25 MED ORDER — AZELASTINE HCL 0.1 % NA SOLN: 1.0000 | Freq: Every day | NASAL | 4 refills | Status: AC

## 2023-07-25 MED ORDER — OMEPRAZOLE MAGNESIUM 20 MG PO TBEC: 20.0000 mg | DELAYED_RELEASE_TABLET | Freq: Every day | ORAL | 1 refills | Status: AC | PRN

## 2023-07-25 MED ORDER — AMLODIPINE BESY-BENAZEPRIL HCL 5-20 MG PO CAPS
1.0000 | ORAL_CAPSULE | Freq: Every day | ORAL | 4 refills | Status: DC
Start: 2023-07-25 — End: 2024-08-11

## 2023-07-25 MED ORDER — ESCITALOPRAM OXALATE 10 MG PO TABS: 10.0000 mg | ORAL_TABLET | Freq: Every day | ORAL | 11 refills | Status: DC

## 2023-07-25 NOTE — Assessment & Plan Note (Signed)
Continue Astelin nasal spray

## 2023-07-25 NOTE — Assessment & Plan Note (Signed)
Work stress contributes to anxiety

## 2023-07-25 NOTE — Assessment & Plan Note (Signed)
Chronic, deteriorated despite sertraline 50 mg daily.  Long-term on this medication, may be losing effect. Discussed options including titrating dose, changing medication in SSRI family, changing antidepressant class i.e. SNRI, adding adjuvant antianxiety medicine such as BuSpar.  We have decided to try Lexapro 10 mg daily in place of sertraline.  She will update me with effect, and determine further need for treatment at that time.

## 2023-07-25 NOTE — Progress Notes (Signed)
Ph: [phone] Fax: [phone]   Patient ID: Inis Sizer, female    DOB: [birth year], 32 y.o.   MRN: [id number]  This visit was conducted in person.  BP (!) 136/92 (BP Location: Right Arm, Cuff Size: Large)   Pulse 75   Temp 99.1 F (37.3 C) (Oral)   Ht 5' 9.25" (1.759 m)   Wt 234 lb 4 oz (106.3 kg)   LMP 07/02/2023   SpO2 99%   BMI 34.34 kg/m   BP Readings from Last 3 Encounters:  07/25/23 (!) 136/92  06/13/22 106/70  03/19/22 122/82   CC: CPE Subjective:   HPI: MIKAYELA MENTZEL is a 32 y.o. female presenting on 07/25/2023 for Annual Exam   HTN - continues lotrel 5/20mg  daily. Had chipotle and caffeine today Strong fmhx HTN.   Notes worsening anxiety due to stress at work. Has been on sertraline 50mg  daily for ~9 years. Feels med may be losing effect. Notes fatigue, but trouble falling asleep. Melatonin causes vivid dreams. Appetite high - overeating, trouble losing weight due to this. Notes energy overall ok but notes trouble focusing. Has had palpitations attributed to anxiety.   Notes mildly itchy red warm rash to upper chest wall. Tried OTC hydrocortisone without benefit. H/o rosacea, ezcema and keratosis pilaris. No new joint or muscle pains, HA, or abd pain, nausea. No sunburn. She does note she stays tired  Preventative: Well woman exam - [facility] last seen 06/2022 with pap smear showing ASCUS - rec rpt in 1 year, due for this.  IUD placed 2017. Upcoming GYN appt next month. Flu - yearly COVID vaccine Pfizer 11/2019, 12/2019, booster 07/2020 Tdap 2016 Seat belt use discussed.  Sunscreen use discussed, no changing moles on skin Sleep - averaging 6-7 hours/night  Smoking - none  Alcohol - social Dentist q6 mo Eye exam yearly  Lives with husband, 2 children, 1 dog Occ: [employer]  Edu: [employer] early education Activity: walking, has treadmill, walks dogs - 12000 steps/day  Diet: good fruits/vegetables, avoids fast foods/processed foods,  good amt water     Relevant past medical, surgical, family and social history reviewed and updated as indicated. Interim medical history since our last visit reviewed. Allergies and medications reviewed and updated. Outpatient Medications Prior to Visit  Medication Sig Dispense Refill   cetirizine (ZYRTEC) 10 MG tablet Take 1 tablet (10 mg total) by mouth daily. 90 tablet 3   PARAGARD INTRAUTERINE COPPER IU 1 Device by Intrauterine route.      amLODipine-benazepril (LOTREL) 5-20 MG capsule TAKE 1 CAPSULE BY MOUTH EVERY DAY 90 capsule 0   azelastine (ASTELIN) 0.1 % nasal spray Place 1 spray into both nostrils daily. Use in each nostril as directed 90 mL 3   omeprazole (PRILOSEC OTC) 20 MG tablet Take 20 mg by mouth daily.     sertraline (ZOLOFT) 50 MG tablet TAKE 1 TABLET BY MOUTH EVERY DAY 90 tablet 0   No facility-administered medications prior to visit.     Per HPI unless specifically indicated in ROS section below Review of Systems  Constitutional:  Negative for activity change, appetite change, chills, fatigue, fever and unexpected weight change.  HENT:  Negative for hearing loss.   Eyes:  Negative for visual disturbance.  Respiratory:  Negative for cough, chest tightness, shortness of breath and wheezing.   Cardiovascular:  Negative for chest pain, palpitations and leg swelling.  Gastrointestinal:  Negative for abdominal distention, abdominal pain, blood in stool, constipation, diarrhea, nausea  and vomiting.  Genitourinary:  Negative for difficulty urinating and hematuria.  Musculoskeletal:  Negative for arthralgias, myalgias and neck pain.  Skin:  Negative for rash.  Neurological:  Negative for dizziness, seizures, syncope and headaches.  Hematological:  Negative for adenopathy. Does not bruise/bleed easily.  Psychiatric/Behavioral:  Negative for dysphoric mood. The patient is nervous/anxious.     Objective:  BP (!) 136/92 (BP Location: Right Arm, Cuff Size: Large)   Pulse 75    Temp 99.1 F (37.3 C) (Oral)   Ht 5' 9.25" (1.759 m)   Wt 234 lb 4 oz (106.3 kg)   LMP 07/02/2023   SpO2 99%   BMI 34.34 kg/m   Wt Readings from Last 3 Encounters:  07/25/23 234 lb 4 oz (106.3 kg)  06/13/22 235 lb (106.6 kg)  03/19/22 232 lb 4 oz (105.3 kg)      Physical Exam Vitals and nursing note reviewed.  Constitutional:      Appearance: Normal appearance. She is not ill-appearing.  HENT:     Head: Normocephalic and atraumatic.     Right Ear: Tympanic membrane, ear canal and external ear normal. There is no impacted cerumen.     Left Ear: Tympanic membrane, ear canal and external ear normal. There is no impacted cerumen.     Mouth/Throat:     Mouth: Mucous membranes are moist.     Pharynx: Oropharynx is clear. No oropharyngeal exudate or posterior oropharyngeal erythema.  Eyes:     General:        Right eye: No discharge.        Left eye: No discharge.     Extraocular Movements: Extraocular movements intact.     Conjunctiva/sclera: Conjunctivae normal.     Pupils: Pupils are equal, round, and reactive to light.  Neck:     Thyroid: No thyroid mass or thyromegaly.  Cardiovascular:     Rate and Rhythm: Normal rate and regular rhythm.     Pulses: Normal pulses.     Heart sounds: Normal heart sounds. No murmur heard. Pulmonary:     Effort: Pulmonary effort is normal. No respiratory distress.     Breath sounds: Normal breath sounds. No wheezing, rhonchi or rales.  Abdominal:     General: Bowel sounds are normal. There is no distension.     Palpations: Abdomen is soft. There is no mass.     Tenderness: There is no abdominal tenderness. There is no guarding or rebound.     Hernia: No hernia is present.  Musculoskeletal:     Cervical back: Normal range of motion and neck supple. No rigidity.     Right lower leg: No edema.     Left lower leg: No edema.  Lymphadenopathy:     Cervical: No cervical adenopathy.  Skin:    General: Skin is warm and dry.     Findings:  Erythema and rash present.          Comments: Erythematous rash to anterior upper chest  Neurological:     General: No focal deficit present.     Mental Status: She is alert. Mental status is at baseline.  Psychiatric:        Mood and Affect: Mood normal.        Behavior: Behavior normal.       Results for orders placed or performed in visit on 07/22/23  CBC with Differential/Platelet  Result Value Ref Range   WBC 8.4 4.0 - 10.5 K/uL   RBC 4.39 3.87 -  5.11 Mil/uL   Hemoglobin 13.9 12.0 - 15.0 g/dL   HCT 16.1 09.6 - 04.5 %   MCV 96.3 78.0 - 100.0 fl   MCHC 32.9 30.0 - 36.0 g/dL   RDW 40.9 81.1 - 91.4 %   Platelets 293.0 150.0 - 400.0 K/uL   Neutrophils Relative % 51.2 43.0 - 77.0 %   Lymphocytes Relative 38.0 12.0 - 46.0 %   Monocytes Relative 9.9 3.0 - 12.0 %   Eosinophils Relative 0.7 0.0 - 5.0 %   Basophils Relative 0.2 0.0 - 3.0 %   Neutro Abs 4.3 1.4 - 7.7 K/uL   Lymphs Abs 3.2 0.7 - 4.0 K/uL   Monocytes Absolute 0.8 0.1 - 1.0 K/uL   Eosinophils Absolute 0.1 0.0 - 0.7 K/uL   Basophils Absolute 0.0 0.0 - 0.1 K/uL  Comprehensive metabolic panel  Result Value Ref Range   Sodium 135 135 - 145 mEq/L   Potassium 3.9 3.5 - 5.1 mEq/L   Chloride 101 96 - 112 mEq/L   CO2 26 19 - 32 mEq/L   Glucose, Bld 79 70 - 99 mg/dL   BUN 17 6 - 23 mg/dL   Creatinine, Ser 7.82 0.40 - 1.20 mg/dL   Total Bilirubin 1.0 0.2 - 1.2 mg/dL   Alkaline Phosphatase 47 39 - 117 U/L   AST 21 0 - 37 U/L   ALT 21 0 - 35 U/L   Total Protein 7.2 6.0 - 8.3 g/dL   Albumin 4.5 3.5 - 5.2 g/dL   GFR 95.62 >13.08 mL/min   Calcium 9.5 8.4 - 10.5 mg/dL  Lipid panel  Result Value Ref Range   Cholesterol 197 0 - 200 mg/dL   Triglycerides 657.8 (H) 0.0 - 149.0 mg/dL   HDL 46.96 >29.52 mg/dL   VLDL 84.1 0.0 - 32.4 mg/dL   LDL Cholesterol 94 0 - 99 mg/dL   Total CHOL/HDL Ratio 3    NonHDL 126.38       07/25/2023    3:29 PM 03/19/2022    3:41 PM 09/12/2020    4:44 PM 12/11/2018   10:42 AM  Depression  screen PHQ 2/9  Decreased Interest 1 0 0 0  Down, Depressed, Hopeless 1 0 0 1  PHQ - 2 Score 2 0 0 1  Altered sleeping 2 2 1 3   Tired, decreased energy 3 2 0 1  Change in appetite 3 0 1 1  Feeling bad or failure about yourself  0 0 0 1  Trouble concentrating 1 1 0 0  Moving slowly or fidgety/restless 1 0 0 0  Suicidal thoughts 0 0 0 0  PHQ-9 Score 12 5 2 7   Difficult doing work/chores Somewhat difficult Not difficult at all         07/25/2023    3:30 PM 03/19/2022    3:42 PM 09/12/2020    4:44 PM 12/11/2018   10:43 AM  GAD 7 : Generalized Anxiety Score  Nervous, Anxious, on Edge 3 0 0 3  Control/stop worrying 3 0 0 3  Worry too much - different things 3 0 1 3  Trouble relaxing 1 0 0 3  Restless 1 0 0 3  Easily annoyed or irritable 0 0 0 2  Afraid - awful might happen 1 0 0 3  Total GAD 7 Score 12 0 1 20  Anxiety Difficulty Somewhat difficult      Assessment & Plan:   Problem List Items Addressed This Visit     Health maintenance examination -  Primary (Chronic)    Preventative protocols reviewed and updated unless pt declined. Discussed healthy diet and lifestyle.       HTN (hypertension)    Chronic, improved on recheck. Continue lotrel 5/20mg  daily. I asked her to start monitoring BP at home and let us know if consistently elevated.      Relevant Medications   amLODipine-benazepril (LOTREL) 5-20 MG capsule   Stress    Work stress contributes to anxiety      GAD (generalized anxiety disorder)    Chronic, deteriorated despite sertraline 50 mg daily.  Long-term on this medication, may be losing effect. Discussed options including titrating dose, changing medication in SSRI family, changing antidepressant class i.e. SNRI, adding adjuvant antianxiety medicine such as BuSpar.  We have decided to try Lexapro 10 mg daily in place of sertraline.  She will update me with effect, and determine further need for treatment at that time.      Relevant Medications   escitalopram  (LEXAPRO) 10 MG tablet   Obesity, Class I, BMI 30.0-34.9 (see actual BMI)   Dyslipidemia    Chronic, adequate off medication.  Continue to monitor. 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      Relevant Medications   azelastine (ASTELIN) 0.1 % nasal spray   Skin rash    She has history of eczema and rosacea.   Question V- sign, poikiloderma. Will further evaluate for autoimmune condition such as dermatomyositis with lab work including ANA, ESR, creatine kinase.      Relevant Orders   ANA   CK   Sedimentation rate     Meds ordered this encounter  Medications   amLODipine-benazepril (LOTREL) 5-20 MG capsule    Sig: Take 1 capsule by mouth daily.    Dispense:  90 capsule    Refill:  4   azelastine (ASTELIN) 0.1 % nasal spray    Sig: Place 1 spray into both nostrils daily. Use in each nostril as directed    Dispense:  90 mL    Refill:  4   omeprazole (PRILOSEC OTC) 20 MG tablet    Sig: Take 1 tablet (20 mg total) by mouth daily as needed (GERD).    Dispense:  90 tablet    Refill:  1   escitalopram (LEXAPRO) 10 MG tablet    Sig: Take 1 tablet (10 mg total) by mouth daily.    Dispense:  30 tablet    Refill:  11    Orders Placed This Encounter  Procedures   ANA   CK   Sedimentation rate    Patient Instructions  Lab test today  Schedule well woman exam with GYN.  Blood pressures were elevated today - start monitoring pressures at home, let us know if consistently >140/90 to titrate medicines accordingly.  Try lexapro in place of sertraline - start 10mg  daily. Update [place] with effect.  Return in 1 year for next physical  Follow up plan: Return in about 1 year (around 07/24/2024) for annual exam, prior fasting for blood work.  Eustaquio Boyden, MD

## 2023-07-25 NOTE — Assessment & Plan Note (Signed)
Chronic, improved on recheck. Continue lotrel 5/20mg  daily. I asked her to start monitoring BP at home and let us know if consistently elevated.

## 2023-07-25 NOTE — Assessment & Plan Note (Signed)
Chronic, adequate off medication.  Continue to monitor. The ASCVD Risk score (Arnett DK, et al., 2019) failed to calculate for the following reasons:   The 2019 ASCVD risk score is only valid for ages 15 to 70

## 2023-07-25 NOTE — Assessment & Plan Note (Addendum)
She has history of eczema and rosacea.   Question V- sign, poikiloderma. Will further evaluate for autoimmune condition such as dermatomyositis with lab work including ANA, ESR, creatine kinase.

## 2023-07-25 NOTE — Assessment & Plan Note (Signed)
Preventative protocols reviewed and updated unless pt declined. Discussed healthy diet and lifestyle.  

## 2023-07-25 NOTE — Patient Instructions (Addendum)
Lab test today  Schedule well woman exam with GYN.  Blood pressures were elevated today - start monitoring pressures at home, let us know if consistently >140/90 to titrate medicines accordingly.  Try lexapro in place of sertraline - start 10mg  daily. Update [place] with effect.  Return in 1 year for next physical

## 2023-07-26 LAB — SEDIMENTATION RATE: Sed Rate: 6 mm/h (ref 0–20)

## 2023-07-26 LAB — CK: Total CK: 93 U/L (ref 29–143)

## 2023-07-26 LAB — ANA: Anti Nuclear Antibody (ANA): NEGATIVE

## 2023-08-27 ENCOUNTER — Encounter: Payer: Self-pay | Admitting: Family Medicine

## 2023-08-28 MED ORDER — ESCITALOPRAM OXALATE 20 MG PO TABS: 20.0000 mg | ORAL_TABLET | Freq: Every day | ORAL | 6 refills | Status: DC

## 2023-08-28 MED ORDER — HYDROXYZINE HCL 25 MG PO TABS: 12.5000 mg | ORAL_TABLET | Freq: Two times a day (BID) | ORAL | 1 refills | Status: DC | PRN

## 2023-09-04 ENCOUNTER — Other Ambulatory Visit: Payer: Self-pay | Admitting: Family Medicine

## 2023-09-08 NOTE — Telephone Encounter (Signed)
ERx 

## 2023-09-15 NOTE — Progress Notes (Unsigned)
PCP:  Eustaquio Boyden, MD   No chief complaint on file.    HPI:      Ms. Jennifer Mays is a 32 y.o. [id number] whose LMP was No LMP recorded. (Menstrual status: IUD)., presents today for her NP> 3 yrs annual examination.  Her menses are regular every 28-30 days, lasting 7 days with 2 heavy days with small clots; with IUD.  Dysmenorrhea mild to mod, improved with NSAIDs, rare BTB.   Sex activity: single partner, contraception - IUD. Paragard placed 10/22/15. Had BTB with several OCPs, nuvaring and depo in past. Happy with Paragard but may want different BC in future since menses heavier and with cramps. Ok for now.  Last Pap: 06/13/22 Results were: ASCUS/neg HPV DNA Hx of STDs: HPV; s/p LEEP 2007  There is no FH of breast cancer. There is no FH of ovarian cancer. The patient does not do self-breast exams.  Tobacco use: The patient denies current or previous tobacco use. Alcohol use: social drinker No drug use.  Exercise: moderately active  She does get adequate calcium but not Vitamin D in her diet.  Patient Active Problem List   Diagnosis Date Noted   Skin rash 07/25/2023   Allergic rhinitis due to animals 03/19/2022   Encounter for issuance of medical certificate 32/44/0102   Health maintenance examination 12/11/2018   Dyslipidemia 12/01/2018   Chronic venous insufficiency 11/30/2018   Obesity, Class I, BMI 30.0-34.9 (see actual BMI) 11/09/2018   Costochondritis 04/23/2012   Varicose veins of bilateral lower extremities with pain 03/26/2012   GAD (generalized anxiety disorder) 08/20/2011   Contraception management 08/20/2011   HTN (hypertension) 07/31/2011   Stress 07/31/2011    Past Surgical History:  Procedure Laterality Date   CHOLECYSTECTOMY N/A 09/15/2019   LAPAROSCOPIC CHOLECYSTECTOMY WITH INTRAOPERATIVE CHOLANGIOGRAM;  Manus Rudd, MD;  Location: [facility] OR;  Service: General;  Laterality: N/A; at [facility]   INTRAUTERINE DEVICE INSERTION  10/22/2015   LEEP  2007    TONSILLECTOMY AND ADENOIDECTOMY  2002    Family History  Problem Relation Age of Onset   Hypertension Father    Asthma Father    Asthma Sister    Parkinsonism Maternal Aunt    Hypertension Paternal Aunt    Hypertension Paternal Uncle    Alcohol abuse Maternal Grandfather    Coronary artery disease Paternal Grandmother 7       MI   Hypertension Paternal Grandmother    Diabetes Paternal Grandfather    Stroke Paternal Grandfather    Hypertension Paternal Grandfather    Cancer Neg Hx     Social History   Socioeconomic History   Marital status: Married    Spouse name: Not on file   Number of children: 2   Years of education: Not on file   Highest education level: Not on file  Occupational History   Occupation: Homemaker  Tobacco Use   Smoking status: Former    Current packs/day: 0.00    Types: Cigarettes    Quit date: 10/07/2012    Years since quitting: 10.9   Smokeless tobacco: Never  Vaping Use   Vaping status: Never Used  Substance and Sexual Activity   Alcohol use: Yes    Alcohol/week: 2.0 standard drinks of alcohol    Types: 2 Glasses of wine per week    Comment: drinks on weekends   Drug use: No   Sexual activity: Yes    Partners: Male    Birth control/protection: I.U.D.    Comment:  Paraguard  Other Topics Concern   Not on file  Social History Narrative   Caffeine: 1 16 oz soda/day   Lives with husband, 2 children, 1 dog   Occ: [employer]    Edu: [employer] early education   Activity: walking, has treadmill    Diet: good fruits/vegetables, avoids fast foods/processed foods, good amt water   Social Determinants of Health   Financial Resource Strain: Not on file  Food Insecurity: Not on file  Transportation Needs: Not on file  Physical Activity: Not on file  Stress: Not on file  Social Connections: Not on file  Intimate Partner Violence: Not on file     Current Outpatient Medications:    amLODipine-benazepril (LOTREL) 5-20 MG capsule, Take 1  capsule by mouth daily., Disp: 90 capsule, Rfl: 4   azelastine (ASTELIN) 0.1 % nasal spray, Place 1 spray into both nostrils daily. Use in each nostril as directed, Disp: 90 mL, Rfl: 4   cetirizine (ZYRTEC) 10 MG tablet, Take 1 tablet (10 mg total) by mouth daily., Disp: 90 tablet, Rfl: 3   escitalopram (LEXAPRO) 20 MG tablet, Take 1 tablet (20 mg total) by mouth daily., Disp: 30 tablet, Rfl: 6   hydrOXYzine (ATARAX) 25 MG tablet, TAKE 0.5-1 TABLET BY MOUTH 2 (TWO) TIMES DAILY AS NEEDED FOR ANXIETY (SEDATION PRECAUTIONS)., Disp: 180 tablet, Rfl: 0   omeprazole (PRILOSEC OTC) 20 MG tablet, Take 1 tablet (20 mg total) by mouth daily as needed (GERD)., Disp: 90 tablet, Rfl: 1   PARAGARD INTRAUTERINE COPPER IU, 1 Device by Intrauterine route. , Disp: , Rfl:      ROS:  Review of Systems  Constitutional:  Negative for fatigue, fever and unexpected weight change.  Respiratory:  Negative for cough, shortness of breath and wheezing.   Cardiovascular:  Negative for chest pain, palpitations and leg swelling.  Gastrointestinal:  Negative for blood in stool, constipation, diarrhea, nausea and vomiting.  Endocrine: Negative for cold intolerance, heat intolerance and polyuria.  Genitourinary:  Negative for dyspareunia, dysuria, flank pain, frequency, genital sores, hematuria, menstrual problem, pelvic pain, urgency, vaginal bleeding, vaginal discharge and vaginal pain.  Musculoskeletal:  Negative for back pain, joint swelling and myalgias.  Skin:  Negative for rash.  Neurological:  Negative for dizziness, syncope, light-headedness, numbness and headaches.  Hematological:  Negative for adenopathy.  Psychiatric/Behavioral:  Negative for agitation, confusion, sleep disturbance and suicidal ideas. The patient is not nervous/anxious.    BREAST: No symptoms   Objective: There were no vitals taken for this visit.   Physical Exam Constitutional:      Appearance: She is well-developed.  Genitourinary:      Vulva normal.     Right Labia: No rash, tenderness or lesions.    Left Labia: No tenderness, lesions or rash.    No vaginal discharge, erythema or tenderness.      Right Adnexa: not tender and no mass present.    Left Adnexa: not tender and no mass present.    No cervical friability or polyp.     IUD strings visualized.     Uterus is not enlarged or tender.  Breasts:    Right: No mass, nipple discharge, skin change or tenderness.     Left: No mass, nipple discharge, skin change or tenderness.  Neck:     Thyroid: No thyromegaly.  Cardiovascular:     Rate and Rhythm: Normal rate and regular rhythm.     Heart sounds: Normal heart sounds. No murmur heard. Pulmonary:  Effort: Pulmonary effort is normal.     Breath sounds: Normal breath sounds.  Abdominal:     Palpations: Abdomen is soft.     Tenderness: There is no abdominal tenderness. There is no guarding or rebound.  Musculoskeletal:        General: Normal range of motion.     Cervical back: Normal range of motion.  Lymphadenopathy:     Cervical: No cervical adenopathy.  Neurological:     General: No focal deficit present.     Mental Status: She is alert and oriented to person, place, and time.     Cranial Nerves: No cranial nerve deficit.  Skin:    General: Skin is warm and dry.  Psychiatric:        Mood and Affect: Mood normal.        Behavior: Behavior normal.        Thought Content: Thought content normal.        Judgment: Judgment normal.  Vitals reviewed.    Assessment/Plan: Encounter for annual routine gynecological examination  Cervical cancer screening - Plan: Cytology - PAP  Screening for HPV (human papillomavirus) - Plan: Cytology - PAP  Encounter for routine checking of intrauterine contraceptive device (IUD)--IUD strings in place; has 10 yr indication. Discussed nexplanon and Mirena.         GYN counsel adequate intake of calcium and vitamin D, diet and exercise     F/U  No follow-ups on  file.  Issak Goley B. Nadirah Socorro, PA-C 09/15/2023 7:02 PM

## 2023-09-16 ENCOUNTER — Ambulatory Visit: Payer: Managed Care, Other (non HMO) | Admitting: Obstetrics and Gynecology

## 2023-09-16 DIAGNOSIS — Z124 Encounter for screening for malignant neoplasm of cervix: Secondary | ICD-10-CM

## 2023-09-16 DIAGNOSIS — R87610 Atypical squamous cells of undetermined significance on cytologic smear of cervix (ASC-US): Secondary | ICD-10-CM

## 2023-09-16 DIAGNOSIS — Z01419 Encounter for gynecological examination (general) (routine) without abnormal findings: Secondary | ICD-10-CM

## 2023-09-16 DIAGNOSIS — Z1151 Encounter for screening for human papillomavirus (HPV): Secondary | ICD-10-CM

## 2023-09-16 DIAGNOSIS — Z30431 Encounter for routine checking of intrauterine contraceptive device: Secondary | ICD-10-CM

## 2023-09-19 ENCOUNTER — Other Ambulatory Visit: Payer: Self-pay | Admitting: Family Medicine

## 2023-09-19 NOTE — Telephone Encounter (Signed)
Message from pharmacy:  REQUEST FOR 90 DAYS PRESCRIPTION.   Lexapro Last filled:  08/28/23, #30 Last OV:  07/25/23, CPE Next OV:  none

## 2023-10-16 ENCOUNTER — Other Ambulatory Visit: Payer: Self-pay | Admitting: Family Medicine

## 2023-10-16 DIAGNOSIS — F411 Generalized anxiety disorder: Secondary | ICD-10-CM

## 2023-12-30 ENCOUNTER — Other Ambulatory Visit: Payer: Self-pay | Admitting: Family Medicine

## 2023-12-30 DIAGNOSIS — F411 Generalized anxiety disorder: Secondary | ICD-10-CM

## 2024-04-26 NOTE — Progress Notes (Signed)
 PCP:  Rilla Baller, MD   Chief Complaint  Patient presents with  . Gynecologic Exam    Discuss BC given BTB bleeding x 5 months     HPI:      Ms. Jennifer Mays is a 33 y.o. [id number] whose LMP was Patient's last menstrual period was 04/16/2024 (exact date)., presents today for her annual examination.  Her menses are regular every Q3 wks since March 2025, lasting 7 days, mod to heavy flow, no BTB, mild dysmen improved with NSAIDs. WNL H/H 10/24. Were monthly last yr, but pt's daughter now with menses and thinks her periods are affected by this.   Sex activity: single partner, contraception - IUD. Paragard placed 10/22/15. Had BTB with several OCPs, nuvaring and depo in past. Happy with Paragard but may want different BC/Mirena in future since menses heavier and with cramps. Ok for now. No pain/bleeding/dryness with sex.  Last Pap: 06/13/22  Results were: ASCUS/neg HPV DNA; repeat pap due today.  Hx of STDs: HPV; s/p LEEP 2007  There is no FH of breast cancer. There is no FH of ovarian cancer. The patient does occas do self-breast exams.  Tobacco use: The patient denies current or previous tobacco use. Alcohol use: social drinker No drug use.  Exercise: moderately active  She does get adequate calcium and Vitamin D  in her diet. Labs with PCP  Patient Active Problem List   Diagnosis Date Noted  . Skin rash 07/25/2023  . Allergic rhinitis due to animals 03/19/2022  . Encounter for issuance of medical certificate 92/83/7979  . Health maintenance examination 12/11/2018  . Dyslipidemia 12/01/2018  . Chronic venous insufficiency 11/30/2018  . Obesity, Class I, BMI 30.0-34.9 (see actual BMI) 11/09/2018  . Costochondritis 04/23/2012  . Varicose veins of bilateral lower extremities with pain 03/26/2012  . GAD (generalized anxiety disorder) 08/20/2011  . Contraception management 08/20/2011  . HTN (hypertension) 07/31/2011  . Stress 07/31/2011    Past Surgical History:   Procedure Laterality Date  . CHOLECYSTECTOMY N/A 09/15/2019   LAPAROSCOPIC CHOLECYSTECTOMY WITH INTRAOPERATIVE CHOLANGIOGRAM;  Belinda Cough, MD;  Location: [facility] OR;  Service: General;  Laterality: N/A; at [facility]  . INTRAUTERINE DEVICE INSERTION  10/22/2015  . LEEP  2007  . TONSILLECTOMY AND ADENOIDECTOMY  2002    Family History  Problem Relation Age of Onset  . Hypertension Father   . Asthma Father   . Asthma Sister   . Parkinsonism Maternal Aunt   . Hypertension Paternal Aunt   . Hypertension Paternal Uncle   . Alcohol abuse Maternal Grandfather   . Coronary artery disease Paternal Grandmother 68       MI  . Hypertension Paternal Grandmother   . Diabetes Paternal Grandfather   . Stroke Paternal Grandfather   . Hypertension Paternal Grandfather   . Cancer Neg Hx     Social History   Socioeconomic History  . Marital status: Married    Spouse name: Not on file  . Number of children: 2  . Years of education: Not on file  . Highest education level: Not on file  Occupational History  . Occupation: Homemaker  Tobacco Use  . Smoking status: Former    Current packs/day: 0.00    Types: Cigarettes    Quit date: 10/07/2012    Years since quitting: 11.5  . Smokeless tobacco: Never  Vaping Use  . Vaping status: Never Used  Substance and Sexual Activity  . Alcohol use: Yes    Alcohol/week: 2.0 standard drinks  of alcohol    Types: 2 Glasses of wine per week    Comment: drinks on weekends  . Drug use: No  . Sexual activity: Yes    Partners: Male    Birth control/protection: I.U.D.    Comment: Paraguard  Other Topics Concern  . Not on file  Social History Narrative   Caffeine: 1 16 oz soda/day   Lives with husband, 2 children, 1 dog   Occ: [employer]    Edu: [employer] early education   Activity: walking, has treadmill    Diet: good fruits/vegetables, avoids fast foods/processed foods, good amt water   Social Drivers of Corporate investment banker Strain: Not on file   Food Insecurity: Not on file  Transportation Needs: Not on file  Physical Activity: Not on file  Stress: Not on file  Social Connections: Not on file  Intimate Partner Violence: Not on file     Current Outpatient Medications:  .  amLODipine -benazepril  (LOTREL) 5-20 MG capsule, Take 1 capsule by mouth daily., Disp: 90 capsule, Rfl: 4 .  azelastine  (ASTELIN ) 0.1 % nasal spray, Place 1 spray into both nostrils daily. Use in each nostril as directed, Disp: 90 mL, Rfl: 4 .  cetirizine  (ZYRTEC ) 10 MG tablet, Take 1 tablet (10 mg total) by mouth daily., Disp: 90 tablet, Rfl: 3 .  escitalopram  (LEXAPRO ) 20 MG tablet, TAKE 1 TABLET BY MOUTH EVERY DAY, Disp: 90 tablet, Rfl: 3 .  hydrOXYzine  (ATARAX ) 25 MG tablet, TAKE 0.5-1 TABLET BY MOUTH 2 (TWO) TIMES DAILY AS NEEDED FOR ANXIETY (SEDATION PRECAUTIONS)., Disp: 180 tablet, Rfl: 1 .  omeprazole  (PRILOSEC  OTC) 20 MG tablet, Take 1 tablet (20 mg total) by mouth daily as needed (GERD)., Disp: 90 tablet, Rfl: 1 .  PARAGARD INTRAUTERINE COPPER IU, 1 Device by Intrauterine route. , Disp: , Rfl:      ROS:  Review of Systems  Constitutional:  Negative for fatigue, fever and unexpected weight change.  Respiratory:  Negative for cough, shortness of breath and wheezing.   Cardiovascular:  Negative for chest pain, palpitations and leg swelling.  Gastrointestinal:  Negative for blood in stool, constipation, diarrhea, nausea and vomiting.  Endocrine: Negative for cold intolerance, heat intolerance and polyuria.  Genitourinary:  Negative for dyspareunia, dysuria, flank pain, frequency, genital sores, hematuria, menstrual problem, pelvic pain, urgency, vaginal bleeding, vaginal discharge and vaginal pain.  Musculoskeletal:  Negative for back pain, joint swelling and myalgias.  Skin:  Negative for rash.  Neurological:  Negative for dizziness, syncope, light-headedness, numbness and headaches.  Hematological:  Negative for adenopathy.  Psychiatric/Behavioral:   Negative for agitation, confusion, sleep disturbance and suicidal ideas. The patient is not nervous/anxious.    BREAST: No symptoms   Objective: BP (!) 135/90   Pulse 70   Ht 5' 9.5 (1.765 m)   Wt 228 lb (103.4 kg)   LMP 04/16/2024 (Exact Date)   BMI 33.19 kg/m    Physical Exam Constitutional:      Appearance: She is well-developed.  Genitourinary:     Vulva normal.     Right Labia: No rash, tenderness or lesions.    Left Labia: No tenderness, lesions or rash.    No vaginal discharge, erythema or tenderness.      Right Adnexa: not tender and no mass present.    Left Adnexa: not tender and no mass present.    No cervical friability or polyp.     IUD strings visualized.     Uterus is not  enlarged or tender.  Breasts:    Right: No mass, nipple discharge, skin change or tenderness.     Left: No mass, nipple discharge, skin change or tenderness.  Neck:     Thyroid : No thyromegaly.  Cardiovascular:     Rate and Rhythm: Normal rate and regular rhythm.     Heart sounds: Normal heart sounds. No murmur heard. Pulmonary:     Effort: Pulmonary effort is normal.     Breath sounds: Normal breath sounds.  Abdominal:     Palpations: Abdomen is soft.     Tenderness: There is no abdominal tenderness. There is no guarding or rebound.  Musculoskeletal:        General: Normal range of motion.     Cervical back: Normal range of motion.  Lymphadenopathy:     Cervical: No cervical adenopathy.  Neurological:     General: No focal deficit present.     Mental Status: She is alert and oriented to person, place, and time.     Cranial Nerves: No cranial nerve deficit.  Skin:    General: Skin is warm and dry.  Psychiatric:        Mood and Affect: Mood normal.        Behavior: Behavior normal.        Thought Content: Thought content normal.        Judgment: Judgment normal.  Vitals reviewed.    Assessment/Plan: Encounter for annual routine gynecological examination  Cervical cancer  screening - Plan: Cytology - PAP  Screening for HPV (human papillomavirus) - Plan: Cytology - PAP  ASCUS of cervix with negative high risk HPV - Plan: Cytology - PAP; repeat pap today ,will f/u if abn.   Encounter for routine checking of intrauterine contraceptive device (IUD)--IUD strings in place; has 10 yr indication. Discussed Mirena.         GYN counsel adequate intake of calcium and vitamin D , diet and exercise     F/U  Return in about 1 year (around 04/27/2025).  Cadden Elizondo B. Alex Mcmanigal, PA-C 04/27/2024 2:06 PM

## 2024-04-27 ENCOUNTER — Encounter: Payer: Self-pay | Admitting: Obstetrics and Gynecology

## 2024-04-27 ENCOUNTER — Ambulatory Visit (INDEPENDENT_AMBULATORY_CARE_PROVIDER_SITE_OTHER): Admitting: Obstetrics and Gynecology

## 2024-04-27 ENCOUNTER — Other Ambulatory Visit (HOSPITAL_COMMUNITY)
Admission: RE | Admit: 2024-04-27 | Discharge: 2024-04-27 | Disposition: A | Discharge status: Home / Self Care | Source: Ambulatory Visit | Attending: Obstetrics and Gynecology | Admitting: Obstetrics and Gynecology

## 2024-04-27 VITALS — BP 135/90 | HR 70 | Ht 69.5 in | Wt 228.0 lb

## 2024-04-27 DIAGNOSIS — Z124 Encounter for screening for malignant neoplasm of cervix: Secondary | ICD-10-CM | POA: Diagnosis present

## 2024-04-27 DIAGNOSIS — Z01419 Encounter for gynecological examination (general) (routine) without abnormal findings: Secondary | ICD-10-CM | POA: Diagnosis present

## 2024-04-27 DIAGNOSIS — Z1151 Encounter for screening for human papillomavirus (HPV): Secondary | ICD-10-CM | POA: Insufficient documentation

## 2024-04-27 DIAGNOSIS — R87610 Atypical squamous cells of undetermined significance on cytologic smear of cervix (ASC-US): Secondary | ICD-10-CM | POA: Diagnosis not present

## 2024-04-27 DIAGNOSIS — Z30431 Encounter for routine checking of intrauterine contraceptive device: Secondary | ICD-10-CM

## 2024-04-27 NOTE — Patient Instructions (Signed)
 I value your feedback and you entrusting [place] with your care. If you get a [place] patient survey, I would appreciate you taking the time to let us know about your experience today. Thank you! ? ? ?

## 2024-05-05 LAB — CYTOLOGY - PAP
Comment: NEGATIVE
Diagnosis: NEGATIVE
Diagnosis: REACTIVE
High risk HPV: NEGATIVE

## 2024-08-07 ENCOUNTER — Other Ambulatory Visit: Payer: Self-pay | Admitting: Family Medicine

## 2024-08-07 DIAGNOSIS — I10 Essential (primary) hypertension: Secondary | ICD-10-CM

## 2024-08-11 NOTE — Telephone Encounter (Signed)
 LMOM asking for call back to schedule cpx.

## 2024-08-11 NOTE — Telephone Encounter (Signed)
 Please schedule CPE as due  #[phone] sent to pharmacy

## 2024-09-23 ENCOUNTER — Other Ambulatory Visit: Payer: Self-pay | Admitting: Family Medicine

## 2024-10-27 ENCOUNTER — Encounter: Admitting: Family Medicine

## 2024-10-27 ENCOUNTER — Telehealth: Payer: Self-pay

## 2024-10-27 NOTE — Telephone Encounter (Signed)
 Called patient she just needed fasting labs scheduled. That appointment has been made.  No further action needed at this time.

## 2024-10-27 NOTE — Telephone Encounter (Signed)
 Copied from CRM #[id number]. Topic: Clinical - Request for Lab/Test Order >> Oct 27, 2024 12:16 PM Nessti S wrote: Reason for CRM: pt would like to order labs   ----------------------------------------------------------------------- From previous Reason for Contact - Lab/Test Results: Reason for CRM:

## 2024-11-07 ENCOUNTER — Other Ambulatory Visit: Payer: Self-pay | Admitting: Family Medicine

## 2024-11-07 DIAGNOSIS — I10 Essential (primary) hypertension: Secondary | ICD-10-CM

## 2024-11-29 ENCOUNTER — Other Ambulatory Visit

## 2024-12-08 ENCOUNTER — Encounter: Admitting: Family Medicine
